# Patient Record
Sex: Female | Born: 1960 | Race: White | Hispanic: No | Marital: Married | Smoking: Never smoker
Health system: Southern US, Community
[De-identification: ages and names within clinical notes are randomized; demographics above are authoritative.]

## PROBLEM LIST (undated history)

## (undated) DIAGNOSIS — M51379 Other intervertebral disc degeneration, lumbosacral region without mention of lumbar back pain or lower extremity pain: Secondary | ICD-10-CM

## (undated) DIAGNOSIS — E039 Hypothyroidism, unspecified: Secondary | ICD-10-CM

## (undated) DIAGNOSIS — J45909 Unspecified asthma, uncomplicated: Secondary | ICD-10-CM

## (undated) DIAGNOSIS — M941 Relapsing polychondritis: Secondary | ICD-10-CM

## (undated) DIAGNOSIS — I10 Essential (primary) hypertension: Secondary | ICD-10-CM

## (undated) DIAGNOSIS — M5137 Other intervertebral disc degeneration, lumbosacral region: Secondary | ICD-10-CM

## (undated) DIAGNOSIS — E785 Hyperlipidemia, unspecified: Secondary | ICD-10-CM

## (undated) DIAGNOSIS — R000 Tachycardia, unspecified: Secondary | ICD-10-CM

## (undated) DIAGNOSIS — L259 Unspecified contact dermatitis, unspecified cause: Secondary | ICD-10-CM

## (undated) HISTORY — DX: Other intervertebral disc degeneration, lumbosacral region: M51.37

## (undated) HISTORY — DX: Unspecified asthma, uncomplicated: J45.909

## (undated) HISTORY — DX: Essential (primary) hypertension: I10

## (undated) HISTORY — DX: Hypothyroidism, unspecified: E03.9

## (undated) HISTORY — DX: Hyperlipidemia, unspecified: E78.5

## (undated) HISTORY — DX: Relapsing polychondritis: M94.1

## (undated) HISTORY — DX: Unspecified contact dermatitis, unspecified cause: L25.9

## (undated) HISTORY — DX: Tachycardia, unspecified: R00.0

## (undated) HISTORY — DX: Other intervertebral disc degeneration, lumbosacral region without mention of lumbar back pain or lower extremity pain: M51.379

## (undated) HISTORY — PX: KNEE SURGERY: SHX244

## (undated) HISTORY — PX: CARPAL TUNNEL RELEASE: SHX101

---

## 1984-07-02 HISTORY — PX: TUBAL LIGATION: SHX77

## 2005-09-18 ENCOUNTER — Encounter: Admission: RE | Admit: 2005-09-18 | Discharge: 2005-09-18 | Payer: Self-pay | Admitting: Family Medicine

## 2005-10-03 HISTORY — PX: US ECHOCARDIOGRAPHY: HXRAD669

## 2007-12-31 ENCOUNTER — Encounter: Admission: RE | Admit: 2007-12-31 | Discharge: 2007-12-31 | Payer: Self-pay | Admitting: Family Medicine

## 2009-12-08 ENCOUNTER — Encounter: Admission: RE | Admit: 2009-12-08 | Discharge: 2009-12-08 | Payer: Self-pay | Admitting: Family Medicine

## 2011-03-03 DIAGNOSIS — L259 Unspecified contact dermatitis, unspecified cause: Secondary | ICD-10-CM

## 2011-03-03 DIAGNOSIS — L308 Other specified dermatitis: Secondary | ICD-10-CM

## 2011-03-03 HISTORY — DX: Unspecified contact dermatitis, unspecified cause: L25.9

## 2011-03-03 HISTORY — DX: Other specified dermatitis: L30.8

## 2011-05-11 ENCOUNTER — Encounter: Payer: Self-pay | Admitting: Cardiology

## 2011-05-14 ENCOUNTER — Ambulatory Visit (INDEPENDENT_AMBULATORY_CARE_PROVIDER_SITE_OTHER): Payer: Medicare Other | Admitting: Cardiology

## 2011-05-14 ENCOUNTER — Encounter: Payer: Self-pay | Admitting: Cardiology

## 2011-05-14 VITALS — BP 145/89 | HR 95 | Resp 18 | Ht 65.0 in | Wt 182.1 lb

## 2011-05-14 DIAGNOSIS — M941 Relapsing polychondritis: Secondary | ICD-10-CM | POA: Insufficient documentation

## 2011-05-14 DIAGNOSIS — R011 Cardiac murmur, unspecified: Secondary | ICD-10-CM | POA: Insufficient documentation

## 2011-05-14 DIAGNOSIS — I10 Essential (primary) hypertension: Secondary | ICD-10-CM | POA: Insufficient documentation

## 2011-05-14 DIAGNOSIS — M948X9 Other specified disorders of cartilage, unspecified sites: Secondary | ICD-10-CM

## 2011-05-14 NOTE — Patient Instructions (Signed)
We will schedule you for an echocardiogram.

## 2011-05-14 NOTE — Progress Notes (Signed)
   Victoria Hull Date of Birth: 12-30-1960 Medical Record #[id number]  History of Present Illness: Victoria Hull is seen today for evaluation of murmur. She is a 50 year old white female who had seen previously in June of 2011 for evaluation of intermittent tachycardia and atypical chest pain. She had a normal cardiac evaluation that time including a stress echo. 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 She has been on steroid therapy. She reports that her dermatologist or her murmur and recommended an echocardiogram. She denies any cardiac complaints. She specifically denies any chest pain, shortness of breath, or palpitations.  Current Outpatient Prescriptions on File Prior to Visit  Medication Sig Dispense Refill  . aspirin 81 MG tablet Take 81 mg by mouth daily.        Marland Kitchen levothyroxine (SYNTHROID, LEVOTHROID) 150 MCG tablet Take 137 mcg by mouth daily.       Marland Kitchen lisinopril (PRINIVIL,ZESTRIL) 20 MG tablet Take 20 mg by mouth daily.          Allergies  Allergen Reactions  . Cats Claw (Uncaria Tomentosa (Cats Claw))     cats  . Penicillins     Past Medical History  Diagnosis Date  . Hypertension   . Hyperlipidemia   . Chest pain   . Tachycardia   . Hypothyroidism   . Relapsing polychondritis     Past Surgical History  Procedure Date  . Tubal ligation   . Knee surgery     RIGHT KNEE  . Carpal tunnel release   . US echocardiography 10/03/2005    EF 55%    History  Smoking status  . Never Smoker   Smokeless tobacco  . Not on file    History  Alcohol Use No    Family History  Problem Relation Age of Onset  . Heart failure Father     Review of Systems: The review of systems is positive for recurrent rash.  All other systems were reviewed and are negative.  Physical Exam: BP 145/89  Pulse 95  Resp 18  Ht 5\' 5"  (1.651 m)  Wt 82.609 kg (182 lb 1.9 oz)  BMI 30.31 kg/m2 The patient is alert and oriented x 3.  The mood and affect are  normal.  The skin is warm and dry.  Color is normal.  The HEENT exam reveals that the sclera are nonicteric.  The mucous membranes are moist.  The carotids are 2+ without bruits.  There is no thyromegaly.  There is no JVD.  The lungs are clear.  The chest wall is non tender.  The heart exam reveals a regular rate with a normal S1 and S2.  There is a very faint and brief systolic ejection murmur at the right upper sternal border..  The PMI is not displaced.   Abdominal exam reveals good bowel sounds.  There is no guarding or rebound.  There is no hepatosplenomegaly or tenderness.  There are no masses.  Exam of the legs reveal no clubbing, cyanosis, or edema.  The legs are without rashes.  The distal pulses are intact.  Cranial nerves II - XII are intact.  Motor and sensory functions are intact.  The gait is normal.  LABORATORY DATA: ECG demonstrates normal sinus rhythm with a normal ECG.  Assessment / Plan:

## 2011-05-14 NOTE — Assessment & Plan Note (Signed)
Her exam is consistent with a very innocent flow murmur. However, given her diagnosis of relapsing polychondritis we will update an echocardiogram to rule out significant valvular disease.

## 2011-05-15 ENCOUNTER — Telehealth: Payer: Self-pay | Admitting: *Deleted

## 2011-05-15 NOTE — Telephone Encounter (Signed)
Faxed office note to Dr. Isaac Bliss [phone]

## 2011-05-22 ENCOUNTER — Ambulatory Visit (HOSPITAL_COMMUNITY): Payer: Medicare Other | Attending: Internal Medicine

## 2011-05-22 DIAGNOSIS — I10 Essential (primary) hypertension: Secondary | ICD-10-CM | POA: Insufficient documentation

## 2011-05-22 DIAGNOSIS — R011 Cardiac murmur, unspecified: Secondary | ICD-10-CM | POA: Insufficient documentation

## 2011-05-22 DIAGNOSIS — E785 Hyperlipidemia, unspecified: Secondary | ICD-10-CM | POA: Insufficient documentation

## 2011-05-22 DIAGNOSIS — I079 Rheumatic tricuspid valve disease, unspecified: Secondary | ICD-10-CM | POA: Insufficient documentation

## 2011-05-22 DIAGNOSIS — M941 Relapsing polychondritis: Secondary | ICD-10-CM

## 2011-05-23 ENCOUNTER — Telehealth: Payer: Self-pay | Admitting: Cardiology

## 2011-05-23 NOTE — Telephone Encounter (Signed)
Fu call °Pt returning your call  °

## 2011-05-23 NOTE — Telephone Encounter (Signed)
Patient was called and told echo showed normal pumping function,no valvular disease,moderate LVH.

## 2011-05-29 ENCOUNTER — Other Ambulatory Visit: Payer: Self-pay | Admitting: Family Medicine

## 2011-05-29 DIAGNOSIS — Z780 Asymptomatic menopausal state: Secondary | ICD-10-CM

## 2011-05-29 DIAGNOSIS — Z1231 Encounter for screening mammogram for malignant neoplasm of breast: Secondary | ICD-10-CM

## 2011-05-29 LAB — HM DEXA SCAN

## 2011-06-01 ENCOUNTER — Institutional Professional Consult (permissible substitution): Payer: Self-pay | Admitting: Cardiovascular Disease

## 2011-06-28 ENCOUNTER — Ambulatory Visit
Admission: RE | Admit: 2011-06-28 | Discharge: 2011-06-28 | Disposition: A | Discharge status: Home / Self Care | Payer: Medicare Other | Source: Ambulatory Visit | Attending: Family Medicine | Admitting: Family Medicine

## 2011-06-28 DIAGNOSIS — Z780 Asymptomatic menopausal state: Secondary | ICD-10-CM

## 2011-06-28 DIAGNOSIS — IMO0002 Reserved for concepts with insufficient information to code with codable children: Secondary | ICD-10-CM

## 2011-06-28 DIAGNOSIS — Z1231 Encounter for screening mammogram for malignant neoplasm of breast: Secondary | ICD-10-CM

## 2011-07-23 ENCOUNTER — Other Ambulatory Visit: Payer: Self-pay | Admitting: Cardiology

## 2012-01-20 ENCOUNTER — Encounter (HOSPITAL_COMMUNITY): Payer: Self-pay

## 2012-01-20 ENCOUNTER — Emergency Department (HOSPITAL_COMMUNITY)
Admission: EM | Admit: 2012-01-20 | Discharge: 2012-01-20 | Disposition: A | Discharge status: Home / Self Care | Payer: Medicare Other | Source: Home / Self Care

## 2012-01-20 ENCOUNTER — Emergency Department (INDEPENDENT_AMBULATORY_CARE_PROVIDER_SITE_OTHER): Payer: Medicare Other

## 2012-01-20 DIAGNOSIS — IMO0002 Reserved for concepts with insufficient information to code with codable children: Secondary | ICD-10-CM

## 2012-01-20 LAB — POCT URINALYSIS DIP (DEVICE)
Bilirubin Urine: NEGATIVE
Glucose, UA: NEGATIVE mg/dL
Ketones, ur: NEGATIVE mg/dL
Nitrite: NEGATIVE

## 2012-01-20 NOTE — ED Notes (Signed)
Pt presents with medial back pain since 7/19. Pt states pain is constant, and "feels like someone punched her in the back and took her breath away". Tender on palpation. Pt reports urinary retention/frequency x 6 weeks. Denies pain on urination.

## 2012-01-20 NOTE — ED Provider Notes (Signed)
History     CSN: [id number]  Arrival date & time 01/20/12  1310   First MD Initiated Contact with Patient 01/20/12 1338      Chief Complaint  Patient presents with  . Back Pain  . Breathing Problem    difficulty breathing    HPI  51 yr old female was doing fair until Friday.  She stated that she had a stabbing pain in her back.  It felt like soemone punched her. It was in the middle of the back and has since moved over to her spine.  The character of pain has changed to a dull constant pain in the right mid back Patient was in the hous at the time and was just gettin ready to start dinner and was waiting on her husban'ds toast to get toasted.   There were no other symptoms assosciated with this-She is known to have a complex history of Relapsing Polychondritis and sees a specialist Dermatologist in [place] She saw her PCP on 12/31/11 Dr. Tanya Nones on the first of July-she told him that she has had some incontinence of the urine She has acutally also been having a lot of LE swelling (chronic) which has gone down since Wednesday She also saw her Dermatologist, Dr. Melina Fiddler at [facility] on Thursday as well and he did some testing for this also-she was recently discontinued off of er Cellcept as well as Doxicyclin for the relapsing polychondritis. She has also recently been slowly tapered off of Chronic steroid therapy for this  When she takes a deep breath it hurts her.  She is not smoker, doesn't take any any hormone replacement-however she states that she has been on a strip and recently Has had a cough for about 6 months She also was Rx with Steroids.    She states last night she took one of her Husband's muscle relaxant pills and felt better   Past Medical History  Diagnosis Date  . Hypertension   . Hyperlipidemia   . Chest pain   . Tachycardia   . Hypothyroidism   . Relapsing polychondritis     Past Surgical History  Procedure Date  . Tubal ligation   . Knee surgery     RIGHT  KNEE  . Carpal tunnel release   . US echocardiography 10/03/2005    EF 55%    Family History  Problem Relation Age of Onset  . Heart failure Father     History  Substance Use Topics  . Smoking status: Never Smoker   . Smokeless tobacco: Not on file  . Alcohol Use: No    OB History    Grav Para Term Preterm Abortions TAB SAB Ect Mult Living                  Review of Systems No SOb, NO CP +Back pain-->Deep breaths cause SOB due to back spasm NO fevers noted-felt warm however No blurred or double vision No cold-has chronic cough since beginning of the year No dysuria No hematuria, but has noted some pink tint to her urine No weakness aon any one side of the body No falls or fainting  Allergies  Cats claw and Penicillins  Home Medications   Current Outpatient Rx  Name Route Sig Dispense Refill  . ASPIRIN 81 MG PO TABS Oral Take 81 mg by mouth daily.      Marland Kitchen DOXYCYCLINE HYCLATE 100 MG PO CPEP Oral Take 100 mg by mouth 2 (two) times daily.      Marland Kitchen  FAMOTIDINE 20 MG PO TABS Oral Take 20 mg by mouth daily.      . OMEGA-3 FATTY ACIDS 1000 MG PO CAPS Oral Take 2 g by mouth daily.      . IRON 66 MG PO TABS Oral Take 65 mg by mouth daily.      Marland Kitchen LEVOTHYROXINE SODIUM 150 MCG PO TABS Oral Take 137 mcg by mouth daily.     Marland Kitchen LISINOPRIL 20 MG PO TABS  TAKE ONE TABLET BY MOUTH EVERY DAY 90 tablet 3  . PREDNISONE PO Oral Take 20 mg by mouth daily.        BP 153/83  Pulse 100  Temp 98.6 F (37 C) (Oral)  Resp 12  SpO2 96%  LMP 08/25/2010  Physical Exam Pleasant alert Caucasian female in no apparent distress Chest has decreased air entry on the right side there is some tactile vocal resonance and fremitus on the right lower lung basal areas No pallor or icterus No nasal discharge noted neck is soft and supple There is point tenderness over the lower back-mainly in the right lower post costal area over ribs 8-10.  Axial rotation of the spine does cause some tenderness and pain  and loading the facet joints with extension causes the same issue Patient is able to bend forward and backward   ED Course  Procedures (including critical care time)  Labs Reviewed  POCT URINALYSIS DIP (DEVICE) - Abnormal; Notable for the following:    Hgb urine dipstick MODERATE (*)     All other components within normal limits   No results found.   No diagnosis found.    MDM  51 year old female who presents with low back pain and some difficulty taking deep breaths My differential diagnoses and broad and includes potential shortness of breath from an undiagnosed pneumonia (x-rays are pending) vs. muscle strain vs. withdrawal from CellCept for her relapsing polychondritis Her UA showed moderate hemoglobin and blood and I am not sure if this is just a confounding factor whether she actually has renal involvement vs. not from her primary process  we will await further management disposition plans depending on results of chest x-ray.  I think that she is at low risk for anything more acute like a PE,MI, especially since her CXR returned negative, and she claimed relief and improved ability to sleep with Muscle relaxant.  i will Rx her with Carbamazepine 2.5 mg qhs, to rpt x1 if worse for sleep a limited quantity  She will need further out-patient work-up with her PCP for 1)Chronic Cough 2) Hematuria  She understood POC as per my discussion with her above.  Pleas Koch, MD            Rhetta Mura, MD 01/21/12 [phone]

## 2012-01-21 ENCOUNTER — Other Ambulatory Visit: Payer: Self-pay | Admitting: Family Medicine

## 2012-01-21 ENCOUNTER — Ambulatory Visit
Admission: RE | Admit: 2012-01-21 | Discharge: 2012-01-21 | Disposition: A | Discharge status: Home / Self Care | Payer: Medicare Other | Source: Ambulatory Visit | Attending: Family Medicine | Admitting: Family Medicine

## 2012-01-21 DIAGNOSIS — R109 Unspecified abdominal pain: Secondary | ICD-10-CM

## 2012-01-21 MED ORDER — DIAZEPAM 5 MG PO TABS: 2.5000 mg | ORAL_TABLET | Freq: Every evening | ORAL | Status: AC | PRN

## 2012-01-21 NOTE — ED Notes (Signed)
Pt seen and treated 7/21 Dr Mahala Menghini - loss of power unable to discharge electronically - written instructions and written prescription for diazepam given to pt  - pt verbalized understanding

## 2012-02-29 ENCOUNTER — Encounter: Payer: Self-pay | Admitting: Emergency Medicine

## 2012-02-29 ENCOUNTER — Ambulatory Visit (INDEPENDENT_AMBULATORY_CARE_PROVIDER_SITE_OTHER): Payer: Medicare Other | Admitting: Emergency Medicine

## 2012-02-29 VITALS — BP 114/80 | HR 94 | Temp 97.8°F | Ht 64.0 in | Wt 185.4 lb

## 2012-02-29 DIAGNOSIS — R05 Cough: Secondary | ICD-10-CM

## 2012-02-29 DIAGNOSIS — R053 Chronic cough: Secondary | ICD-10-CM | POA: Insufficient documentation

## 2012-02-29 NOTE — Progress Notes (Signed)
  Subjective:    Patient ID: Victoria Hull, female    DOB: Jun 17, 1961, 51 y.o.   MRN: [id number]  HPI 51 yo woman, never smoker, hx relapsing polychondritis (formerly on Pred, Cellcept - currently on doxycycline for the rash component), HTN, allergies especially to cats (skin tested 12/2010), Asthma dx by urgent care in 1993. She has rarely needed SABA or any treatment since then. She is referred for chronic cough, started over 7 months ago, has been paroxysmal, non-productive. She was on lisinopril, stopped 02/05/12 and changed to benicar. She was also started on flovent. Her cough is much better after these changes.    Review of Systems  Constitutional: Negative for fever and unexpected weight change.  HENT: Positive for postnasal drip. Negative for ear pain, nosebleeds, congestion, sore throat, rhinorrhea, sneezing, trouble swallowing, dental problem and sinus pressure.   Eyes: Positive for redness and itching.  Respiratory: Positive for cough, shortness of breath and wheezing. Negative for chest tightness.   Cardiovascular: Positive for palpitations and leg swelling.  Gastrointestinal: Positive for nausea and vomiting.  Genitourinary: Negative for dysuria.  Musculoskeletal: Positive for joint swelling.  Skin: Positive for rash.  Neurological: Negative for headaches.  Hematological: Does not bruise/bleed easily.  Psychiatric/Behavioral: Negative for dysphoric mood. The patient is not nervous/anxious.        Objective:   Physical Exam Filed Vitals:   02/29/12 1556  BP: 114/80  Pulse: 94  Temp: 97.8 F (36.6 C)   Gen: Pleasant, well-nourished, in no distress,  normal affect  ENT: No lesions,  mouth clear,  oropharynx clear, no postnasal drip  Neck: No JVD, no TMG, no carotid bruits  Lungs: No use of accessory muscles, no dullness to percussion, clear without rales or rhonchi  Cardiovascular: RRR, heart sounds normal, no murmur or gallops, no peripheral  edema  Musculoskeletal: No deformities, no cyanosis or clubbing  Neuro: alert, non focal  Skin: Warm, no lesions or rashes      Assessment & Plan:  Chronic cough Suspect she probably does have asthma. Other factors that are likely contributing are allergic rhinitis. Stopping the lisinopril may have been the most important factor here. She also believes the flovent is helping.  - contineu flovent - pft - start nasal steroid and lisinopril if cough resumes - rov next available

## 2012-02-29 NOTE — Patient Instructions (Addendum)
Please continue your Flovent twice a day Have your albuterol available to use as needed We will perform full pulmonary function testing at your next office visit If your cough starts to flare, start an allergy regimen that included loratadine (Claritin) 10mg  daily and a steroid nasal spray (call our office to order). Follow with Dr Delton Coombes next available with full PFT

## 2012-02-29 NOTE — Assessment & Plan Note (Addendum)
Suspect she probably does have asthma. Other factors that are likely contributing are allergic rhinitis. Stopping the lisinopril may have been the most important factor here. She also believes the flovent is helping.  - contineu flovent - pft - start nasal steroid and lisinopril if cough resumes - rov next available

## 2012-04-21 ENCOUNTER — Ambulatory Visit (INDEPENDENT_AMBULATORY_CARE_PROVIDER_SITE_OTHER): Payer: Medicare Other | Admitting: Emergency Medicine

## 2012-04-21 ENCOUNTER — Encounter: Payer: Self-pay | Admitting: Emergency Medicine

## 2012-04-21 VITALS — BP 140/82 | HR 88 | Temp 98.5°F | Ht 65.0 in | Wt 186.0 lb

## 2012-04-21 DIAGNOSIS — R05 Cough: Secondary | ICD-10-CM

## 2012-04-21 LAB — PULMONARY FUNCTION TEST

## 2012-04-21 NOTE — Progress Notes (Signed)
PFT done today. 

## 2012-04-21 NOTE — Progress Notes (Signed)
  Subjective:    Patient ID: Victoria Hull, female    DOB: [birth year], 51 y.o.   MRN: [id number]  HPI 51 yo woman, never smoker, hx relapsing polychondritis (formerly on Pred, Cellcept - currently on doxycycline for the rash component), HTN, allergies especially to cats (skin tested 12/2010), Asthma dx by urgent care in 1993. She has rarely needed SABA or any treatment since then. She is referred for chronic cough, started over 7 months ago, has been paroxysmal, non-productive. She was on lisinopril, stopped 02/05/12 and changed to benicar. She was also started on flovent. Her cough is much better after these changes.   ROV 04/21/12 -- hx relapsing polychondritis (formerly on Pred, Cellcept - currently on doxycycline for the rash component), HTN, allergies especially to cats (skin tested 12/2010), Asthma dx by urgent care in 1993. She has rarely needed SABA or any treatment since then. She was better on flovent qd at last visit. Since last time she has gone back on lisinopril, about a month ago. She is still coughing, but knows that stopping flovent makes it worse.       Objective:   Physical Exam Filed Vitals:   04/21/12 1636  BP: 140/82  Pulse: 88  Temp: 98.5 F (36.9 C)   Gen: Pleasant, well-nourished, in no distress,  normal affect  ENT: No lesions,  mouth clear,  oropharynx clear, no postnasal drip  Neck: No JVD, no TMG, no carotid bruits  Lungs: No use of accessory muscles, no dullness to percussion, clear without rales or rhonchi  Cardiovascular: RRR, heart sounds normal, no murmur or gallops, no peripheral edema  Musculoskeletal: No deformities, no cyanosis or clubbing  Neuro: alert, non focal  Skin: Warm, no lesions or rashes      Assessment & Plan:  Chronic cough With evidence on today's PFT for moderate AFL and asthma.  - continue the flovent, SABA prn - stop lisinopril - follow up 6 mo

## 2012-04-21 NOTE — Patient Instructions (Addendum)
Please continue your flovent and albuterol as you are taking them Stop lisinopril Follow with Dr Delton Coombes in 6 months or sooner if you have any problems

## 2012-04-21 NOTE — Assessment & Plan Note (Signed)
With evidence on today's PFT for moderate AFL and asthma.  - continue the flovent, SABA prn - stop lisinopril - follow up 6 mo

## 2012-08-20 ENCOUNTER — Encounter: Payer: Self-pay | Admitting: *Deleted

## 2012-09-23 ENCOUNTER — Other Ambulatory Visit: Payer: Self-pay | Admitting: Family Medicine

## 2012-09-23 ENCOUNTER — Other Ambulatory Visit: Payer: Self-pay | Admitting: Cardiology

## 2012-09-25 ENCOUNTER — Other Ambulatory Visit: Payer: Self-pay | Admitting: Cardiology

## 2012-09-26 NOTE — Telephone Encounter (Signed)
lisinopril (PRINIVIL,ZESTRIL) 20 MG tablet 90 tablet 0 09/23/2012       Sig:  TAKE ONE TABLET BY MOUTH EVERY DAY    Class:  Normal    DAW:  No    Authorizing Provider:  Peter M [place], MD    Ordering User:  Jacquelin Hawking

## 2012-10-20 ENCOUNTER — Encounter: Payer: Self-pay | Admitting: Emergency Medicine

## 2012-10-20 ENCOUNTER — Ambulatory Visit (INDEPENDENT_AMBULATORY_CARE_PROVIDER_SITE_OTHER): Payer: Medicare Other | Admitting: Emergency Medicine

## 2012-10-20 ENCOUNTER — Telehealth: Payer: Self-pay | Admitting: Family Medicine

## 2012-10-20 VITALS — BP 124/84 | HR 88 | Temp 97.8°F | Ht 65.0 in | Wt 190.4 lb

## 2012-10-20 DIAGNOSIS — J45909 Unspecified asthma, uncomplicated: Secondary | ICD-10-CM

## 2012-10-20 DIAGNOSIS — R05 Cough: Secondary | ICD-10-CM

## 2012-10-20 DIAGNOSIS — R059 Cough, unspecified: Secondary | ICD-10-CM

## 2012-10-20 MED ORDER — PRAVASTATIN SODIUM 40 MG PO TABS: ORAL_TABLET | ORAL | Status: DC

## 2012-10-20 NOTE — Assessment & Plan Note (Signed)
Stop flovent Keep albuterol available to use as needed for shortness of breath.  Follow with Dr Delton Coombes in 6 months or sooner if you have any problems

## 2012-10-20 NOTE — Patient Instructions (Addendum)
Stop flovent Keep albuterol available to use as needed for shortness of breath.  Follow with Dr Legacy Carrender in 6 months or sooner if you have any problems 

## 2012-10-20 NOTE — Assessment & Plan Note (Signed)
Improved off lisinopril 

## 2012-10-20 NOTE — Telephone Encounter (Signed)
Rx Refilled  

## 2012-10-20 NOTE — Progress Notes (Signed)
  Subjective:    Patient ID: Victoria Hull, female    DOB: Jun 27, 1961, 52 y.o.   MRN: [id number]  HPI 52 yo woman, never smoker, hx relapsing polychondritis (formerly on Pred, Cellcept - currently on doxycycline for the rash component), HTN, allergies especially to cats (skin tested 12/2010), Asthma dx by urgent care in 1993. She has rarely needed SABA or any treatment since then. She is referred for chronic cough, started over 7 months ago, has been paroxysmal, non-productive. She was on lisinopril, stopped 02/05/12 and changed to benicar. She was also started on flovent. Her cough is much better after these changes.   ROV 54/21/13 -- hx relapsing polychondritis (formerly on Pred, Cellcept - currently on doxycycline for the rash component), HTN, allergies especially to cats (skin tested 12/2010), Asthma dx by urgent care in 1993. She has rarely needed SABA or any treatment since then. She was better on flovent qd at last visit. Since last time she has gone back on lisinopril, about a month ago. She is still coughing, but knows that stopping flovent makes it worse.   ROV 54/21/14 -- hx relapsing polychondritis (formerly on Pred, Cellcept - currently on doxycycline for the rash component), HTN, allergies especially to cats (skin tested 12/2010), Asthma confirmed on spiro 10/'13. Last time we stopped lisinopril, continued flovent, but she has only been using prn. She reports that the cough is better since last time. Polychondritis >> pred just restarted at 5mg . She uses albuterol prn > uses 2x a month. Has some exertional SOB with walking, gardening.      Objective:   Physical Exam Filed Vitals:   10/20/12 1522  BP: 124/84  Pulse: 88  Temp: 97.8 F (36.6 C)   Gen: Pleasant, well-nourished, in no distress,  normal affect  ENT: No lesions,  mouth clear,  oropharynx clear, no postnasal drip  Neck: No JVD, no TMG, no carotid bruits  Lungs: No use of accessory muscles, no dullness to percussion,  clear without rales or rhonchi  Cardiovascular: RRR, heart sounds normal, no murmur or gallops, no peripheral edema  Musculoskeletal: No deformities, no cyanosis or clubbing  Neuro: alert, non focal  Skin: Warm, no lesions or rashes      Assessment & Plan:  Intrinsic asthma Stop flovent Keep albuterol available to use as needed for shortness of breath.  Follow with Dr Delton Coombes in 6 months or sooner if you have any problems  Chronic cough Improved off lisinopril

## 2012-10-21 ENCOUNTER — Other Ambulatory Visit (INDEPENDENT_AMBULATORY_CARE_PROVIDER_SITE_OTHER): Payer: Medicare Other

## 2012-10-21 DIAGNOSIS — Z0000 Encounter for general adult medical examination without abnormal findings: Secondary | ICD-10-CM

## 2012-10-21 DIAGNOSIS — E039 Hypothyroidism, unspecified: Secondary | ICD-10-CM

## 2012-10-21 LAB — TSH: TSH: 3.222 u[IU]/mL (ref 0.350–4.500)

## 2012-10-22 MED ORDER — LEVOTHYROXINE SODIUM 150 MCG PO TABS: 137.0000 ug | ORAL_TABLET | Freq: Every day | ORAL | Status: DC

## 2013-01-01 ENCOUNTER — Telehealth: Payer: Self-pay

## 2013-01-01 MED ORDER — LISINOPRIL 20 MG PO TABS: 20.0000 mg | ORAL_TABLET | Freq: Every day | ORAL | Status: DC

## 2013-01-01 NOTE — Telephone Encounter (Signed)
Spoke to patient she stated she takes lisinopril 20 mg daily and needs a refill.Advised will refill, will check with Dr.Jordan about a follow up appointment.

## 2013-01-13 NOTE — Telephone Encounter (Signed)
Spoke with patient Dr.Jordan advised to needs to get lisinopril refills from PCP.Patient is to follow up with Dr.Jordan as needed.

## 2013-03-04 ENCOUNTER — Ambulatory Visit (INDEPENDENT_AMBULATORY_CARE_PROVIDER_SITE_OTHER): Payer: Medicare Other | Admitting: Family Medicine

## 2013-03-04 ENCOUNTER — Other Ambulatory Visit: Payer: Self-pay | Admitting: Family Medicine

## 2013-03-04 ENCOUNTER — Ambulatory Visit: Payer: Medicare Other

## 2013-03-04 DIAGNOSIS — Z23 Encounter for immunization: Secondary | ICD-10-CM

## 2013-03-04 MED ORDER — LEVOTHYROXINE SODIUM 137 MCG PO TABS: 137.0000 ug | ORAL_TABLET | Freq: Every day | ORAL | Status: DC

## 2013-03-04 NOTE — Telephone Encounter (Signed)
Med refilled for correct dose

## 2013-05-18 ENCOUNTER — Encounter: Payer: Self-pay | Admitting: Emergency Medicine

## 2013-05-18 ENCOUNTER — Ambulatory Visit (INDEPENDENT_AMBULATORY_CARE_PROVIDER_SITE_OTHER): Payer: Medicare Other | Admitting: Emergency Medicine

## 2013-05-18 VITALS — BP 170/100 | HR 88 | Ht 65.0 in | Wt 190.0 lb

## 2013-05-18 DIAGNOSIS — J45909 Unspecified asthma, uncomplicated: Secondary | ICD-10-CM

## 2013-05-18 MED ORDER — FLUTICASONE PROPIONATE HFA 110 MCG/ACT IN AERO: 2.0000 | INHALATION_SPRAY | Freq: Two times a day (BID) | RESPIRATORY_TRACT | Status: DC | PRN

## 2013-05-18 MED ORDER — ALBUTEROL SULFATE HFA 108 (90 BASE) MCG/ACT IN AERS: 2.0000 | INHALATION_SPRAY | Freq: Four times a day (QID) | RESPIRATORY_TRACT | Status: DC | PRN

## 2013-05-18 NOTE — Assessment & Plan Note (Signed)
-   at this time will wait on the flovent, see if restarting cellcept and pred will be adequate.  - saba prn - trigger avoidance.

## 2013-05-18 NOTE — Patient Instructions (Signed)
Please continue your Cellcept and prednisone as you are taking them Use albuterol as needed Continue your famotidine Follow with Dr Delton Coombes in 4 months or sooner if you have any problems.

## 2013-05-18 NOTE — Progress Notes (Signed)
  Subjective:    Patient ID: Victoria Hull, female    DOB: 07-06-1960, 52 y.o.   MRN: [id number]  HPI 52 yo woman, never smoker, hx relapsing polychondritis (formerly on Pred, Cellcept - currently on doxycycline for the rash component), HTN, allergies especially to cats (skin tested 12/2010), Asthma dx by urgent care in 1993. She has rarely needed SABA or any treatment since then. She is referred for chronic cough, started over 7 months ago, has been paroxysmal, non-productive. She was on lisinopril, stopped 02/05/12 and changed to benicar. She was also started on flovent. Her cough is much better after these changes.   ROV 04/21/12 -- hx relapsing polychondritis (formerly on Pred, Cellcept - currently on doxycycline for the rash component), HTN, allergies especially to cats (skin tested 12/2010), Asthma dx by urgent care in 1993. She has rarely needed SABA or any treatment since then. She was better on flovent qd at last visit. Since last time she has gone back on lisinopril, about a month ago. She is still coughing, but knows that stopping flovent makes it worse.   ROV 10/20/12 -- hx relapsing polychondritis (formerly on Pred, Cellcept - currently on doxycycline for the rash component), HTN, allergies especially to cats (skin tested 12/2010), Asthma confirmed on spiro 10/'13. Last time we stopped lisinopril, continued flovent, but she has only been using prn. She reports that the cough is better since last time. Polychondritis >> pred just restarted at 5mg . She uses albuterol prn > uses 2x a month. Has some exertional SOB with walking, gardening.   ROV 05/18/13 -- hx relapsing polychondritis (formerly on Pred, Cellcept - currently on doxycycline for the rash component), HTN, allergies especially to cats (skin tested 12/2010), Asthma.  Last time we stopped flovent > seemed to tolerate. She also stopped all her other meds - pred, cellcept, etc x 3 months. She since had to restart them, had rash as initial sx.  Now back pred 10, cellcept.  Now using albuterol bid now (was quite rare).      Objective:   Physical Exam Filed Vitals:   05/18/13 1522  BP: 170/100  Pulse: 88  Height: 5\' 5"  (1.651 m)  Weight: 190 lb (86.183 kg)  SpO2: 96%   Gen: Pleasant, well-nourished, in no distress,  normal affect  ENT: No lesions,  mouth clear,  oropharynx clear, no postnasal drip  Neck: No JVD, no TMG, no carotid bruits  Lungs: No use of accessory muscles, clear without rales or rhonchi  Cardiovascular: RRR, heart sounds normal, no murmur or gallops, no peripheral edema  Musculoskeletal: No deformities, no cyanosis or clubbing  Neuro: alert, non focal  Skin: Warm, no lesions or rashes      Assessment & Plan:  Intrinsic asthma - at this time will wait on the flovent, see if restarting cellcept and pred will be adequate.  - saba prn - trigger avoidance.

## 2013-06-04 ENCOUNTER — Encounter: Payer: Self-pay | Admitting: Family Medicine

## 2013-06-05 ENCOUNTER — Other Ambulatory Visit: Payer: Self-pay | Admitting: Family Medicine

## 2013-06-05 ENCOUNTER — Other Ambulatory Visit: Payer: Medicare Other

## 2013-06-05 DIAGNOSIS — E785 Hyperlipidemia, unspecified: Secondary | ICD-10-CM

## 2013-06-05 DIAGNOSIS — Z79899 Other long term (current) drug therapy: Secondary | ICD-10-CM

## 2013-06-05 DIAGNOSIS — I10 Essential (primary) hypertension: Secondary | ICD-10-CM

## 2013-06-05 DIAGNOSIS — E039 Hypothyroidism, unspecified: Secondary | ICD-10-CM

## 2013-06-05 LAB — LIPID PANEL
Cholesterol: 194 mg/dL (ref 0–200)
HDL: 37 mg/dL — ABNORMAL LOW (ref >39)
Total CHOL/HDL Ratio: 5.2 Ratio
Triglycerides: 202 mg/dL — ABNORMAL HIGH (ref <150)
VLDL: 40 mg/dL (ref 0–40)

## 2013-06-05 LAB — CBC WITH DIFFERENTIAL/PLATELET
Basophils Absolute: 0 10*3/uL (ref 0.0–0.1)
Basophils Relative: 0 % (ref 0–1)
Eosinophils Relative: 3 % (ref 0–5)
HCT: 35.4 % — ABNORMAL LOW (ref 36.0–46.0)
Hemoglobin: 11.4 g/dL — ABNORMAL LOW (ref 12.0–15.0)
Lymphs Abs: 3.2 10*3/uL (ref 0.7–4.0)
MCH: 25.4 pg — ABNORMAL LOW (ref 26.0–34.0)
MCHC: 32.2 g/dL (ref 30.0–36.0)
MCV: 79 fL (ref 78.0–100.0)
Monocytes Absolute: 0.5 10*3/uL (ref 0.1–1.0)
RBC: 4.48 MIL/uL (ref 3.87–5.11)
RDW: 16.8 % — ABNORMAL HIGH (ref 11.5–15.5)

## 2013-06-05 LAB — COMPLETE METABOLIC PANEL WITH GFR
AST: 11 U/L (ref 0–37)
Alkaline Phosphatase: 87 U/L (ref 39–117)
BUN: 23 mg/dL (ref 6–23)
Calcium: 9.1 mg/dL (ref 8.4–10.5)
Creat: 0.85 mg/dL (ref 0.50–1.10)
GFR, Est Non African American: 78 mL/min

## 2013-06-05 NOTE — Telephone Encounter (Signed)
I called patient.  She is coming this morning for fasting lab work and then will schedule appt to see provider early next week

## 2013-06-09 ENCOUNTER — Encounter: Payer: Self-pay | Admitting: Family Medicine

## 2013-06-09 ENCOUNTER — Ambulatory Visit (INDEPENDENT_AMBULATORY_CARE_PROVIDER_SITE_OTHER): Payer: Medicare Other | Admitting: Family Medicine

## 2013-06-09 VITALS — BP 150/80 | HR 84 | Temp 97.5°F | Resp 18 | Ht 65.0 in | Wt 188.0 lb

## 2013-06-09 DIAGNOSIS — Z1211 Encounter for screening for malignant neoplasm of colon: Secondary | ICD-10-CM

## 2013-06-09 DIAGNOSIS — I10 Essential (primary) hypertension: Secondary | ICD-10-CM

## 2013-06-09 DIAGNOSIS — E785 Hyperlipidemia, unspecified: Secondary | ICD-10-CM

## 2013-06-09 MED ORDER — OMEPRAZOLE 20 MG PO CPDR: 40.0000 mg | DELAYED_RELEASE_CAPSULE | Freq: Every day | ORAL | Status: DC

## 2013-06-09 NOTE — Progress Notes (Signed)
Subjective:    Patient ID: Victoria Hull, female    DOB: 1961/03/10, 52 y.o.   MRN: [id number]  HPI Patient is here today for followup of her hypertension as well as her hyperlipidemia. She is going only start and 50 mg by mouth daily. Her blood pressures ranging 140-150 over 80s. She denies any chest pain, shortness of breath, dyspnea on exertion. She does report a persistent daily cough which she thinks may be due to her acid reflux. She is currently on Pepcid. She denies any melena or hematochezia. She denies any indigestion. She does have occasional postnasal drip. She is also on pravastatin 40 mg by mouth daily for hyperlipidemia. She denies any myalgias or right upper quadrant pain. Her most recent labwork as listed below: Appointment on 06/05/2013  Component Date Value Range Status  . Cholesterol 06/05/2013 194  0 - 200 mg/dL Final   Comment: ATP III Classification:                                < 200        mg/dL        Desirable                               200 - 239     mg/dL        Borderline High                               >= 240        mg/dL        High                             . Triglycerides 06/05/2013 202* <150 mg/dL Final  . HDL 57/84/6962 37* >39 mg/dL Final  . Total CHOL/HDL Ratio 06/05/2013 5.2   Final  . VLDL 06/05/2013 40  0 - 40 mg/dL Final  . LDL Cholesterol 06/05/2013 117* 0 - 99 mg/dL Final   Comment:                            Total Cholesterol/HDL Ratio:CHD Risk                                                 Coronary Heart Disease Risk Table                                                                 Men       Women                                   1/2 Average Risk              3.4        3.3  Average Risk              5.0        4.4                                    2X Average Risk              9.6        7.1                                    3X Average Risk             23.4       11.0          Use the calculated Patient Ratio above and the CHD Risk table                           to determine the patient's CHD Risk.                          ATP III Classification (LDL):                                < 100        mg/dL         Optimal                               100 - 129     mg/dL         Near or Above Optimal                               130 - 159     mg/dL         Borderline High                               160 - 189     mg/dL         High                                > 190        mg/dL         Very High                             . TSH 06/05/2013 1.461  0.350 - 4.500 uIU/mL Final  . WBC 06/05/2013 9.8  4.0 - 10.5 K/uL Final  . RBC 06/05/2013 4.48  3.87 - 5.11 MIL/uL Final  . Hemoglobin 06/05/2013 11.4* 12.0 - 15.0 g/dL Final  . HCT 16/04/9603 35.4* 36.0 - 46.0 % Final  . MCV 06/05/2013 79.0  78.0 - 100.0 fL Final  . MCH 06/05/2013 25.4* 26.0 - 34.0 pg Final  . MCHC 06/05/2013 32.2  30.0 - 36.0 g/dL Final  . RDW 54/03/8118 16.8* 11.5 - 15.5 % Final  . Platelets 06/05/2013 386  150 - 400  K/uL Final  . Neutrophils Relative % 06/05/2013 60  43 - 77 % Final  . Neutro Abs 06/05/2013 5.9  1.7 - 7.7 K/uL Final  . Lymphocytes Relative 06/05/2013 32  12 - 46 % Final  . Lymphs Abs 06/05/2013 3.2  0.7 - 4.0 K/uL Final  . Monocytes Relative 06/05/2013 5  3 - 12 % Final  . Monocytes Absolute 06/05/2013 0.5  0.1 - 1.0 K/uL Final  . Eosinophils Relative 06/05/2013 3  0 - 5 % Final  . Eosinophils Absolute 06/05/2013 0.3  0.0 - 0.7 K/uL Final  . Basophils Relative 06/05/2013 0  0 - 1 % Final  . Basophils Absolute 06/05/2013 0.0  0.0 - 0.1 K/uL Final  . Smear Review 06/05/2013 Criteria for review not met   Final  . Sodium 06/05/2013 141  135 - 145 mEq/L Final  . Potassium 06/05/2013 4.4  3.5 - 5.3 mEq/L Final  . Chloride 06/05/2013 103  96 - 112 mEq/L Final  . CO2 06/05/2013 27  19 - 32 mEq/L Final  . Glucose, Bld 06/05/2013 84  70 - 99 mg/dL Final  . BUN 81/19/1478 23   6 - 23 mg/dL Final  . Creat 29/56/2130 0.85  0.50 - 1.10 mg/dL Final  . Total Bilirubin 06/05/2013 0.3  0.3 - 1.2 mg/dL Final  . Alkaline Phosphatase 06/05/2013 87  39 - 117 U/L Final  . AST 06/05/2013 11  0 - 37 U/L Final  . ALT 06/05/2013 <8  0 - 35 U/L Final  . Total Protein 06/05/2013 6.9  6.0 - 8.3 g/dL Final  . Albumin 86/57/8469 3.6  3.5 - 5.2 g/dL Final  . Calcium 62/95/2841 9.1  8.4 - 10.5 mg/dL Final  . GFR, Est African American 06/05/2013 >89   Final  . GFR, Est Non African American 06/05/2013 78   Final   Comment:                            The estimated GFR is a calculation valid for adults (>=36 years old)                          that uses the CKD-EPI algorithm to adjust for age and sex. It is                            not to be used for children, pregnant women, hospitalized patients,                             patients on dialysis, or with rapidly changing kidney function.                          According to the NKDEP, eGFR >89 is normal, 60-89 shows mild                          impairment, 30-59 shows moderate impairment, 15-29 shows severe                          impairment and <15 is ESRD.  Past Medical History  Diagnosis Date  . Hypertension   . Hyperlipidemia   . Chest pain   . Tachycardia   . Hypothyroidism   . Relapsing polychondritis   . Asthma   . DDD (degenerative disc disease), lumbosacral   . 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 albuterol (PROAIR HFA) 108 (90 BASE) MCG/ACT inhaler Inhale 2 puffs into the lungs every 6 (six) hours as needed.  1 Inhaler  6  . aspirin 81 MG tablet Take 81 mg by mouth daily.        . Calcium Carbonate-Vitamin D (CALCIUM + D PO) Take by mouth once.      . clobetasol cream (TEMOVATE) 0.05 % Apply topically Twice daily as needed.      . doxycycline (VIBRAMYCIN) 100 MG capsule Take 100 mg  by mouth 2 (two) times daily.      . famotidine (PEPCID) 20 MG tablet Take 20 mg by mouth daily.        . fish oil-omega-3 fatty acids 1000 MG capsule Take 2 g by mouth daily. 1300mg  daily      . fluticasone (FLOVENT HFA) 110 MCG/ACT inhaler Inhale 2 puffs into the lungs 2 (two) times daily as needed.  1 Inhaler  11  . levothyroxine (SYNTHROID, LEVOTHROID) 137 MCG tablet Take 1 tablet (137 mcg total) by mouth daily before breakfast.  90 tablet  3  . losartan (COZAAR) 50 MG tablet 1 tablet daily.      . Multiple Vitamin (MULTIVITAMIN) capsule Take 1 capsule by mouth daily.      . Multiple Vitamins-Minerals (VISION FORMULA PO) Take 1 tablet by mouth daily.       . Nutritional Supplements (ESTROVEN PO) Take 1 tablet by mouth daily.      . pravastatin (PRAVACHOL) 40 MG tablet TAKE ONE TABLET BY MOUTH EVERY DAY AT BEDTIME  30 tablet  5   No current facility-administered medications on file prior to visit.   Allergies  Allergen Reactions  . Cats Claw [Uncaria Tomentosa (Cats Claw)]     cats  . Niaspan [Niacin] Other (See Comments)    Myalgias in legs  . Penicillins    History   Social History  . Marital Status: Married    Spouse Name: N/A    Number of Children: 5  . Years of Education: N/A   Occupational History  . Not on file.   Social History Main Topics  . Smoking status: Never Smoker   . Smokeless tobacco: Never Used  . Alcohol Use: No  . Drug Use: No  . Sexual Activity: Not on file   Other Topics Concern  . Not on file   Social History Narrative  . No narrative on file      Review of Systems  All other systems reviewed and are negative.       Objective:   Physical Exam  Vitals reviewed. Neck: Neck supple. No JVD present. No thyromegaly present.  Cardiovascular: Normal rate, regular rhythm and normal heart sounds.  Exam reveals no gallop and no friction rub.   No murmur heard. Pulmonary/Chest: Effort normal and breath sounds normal. No respiratory distress.  She has no wheezes. She has no rales.  Abdominal: Soft. Bowel sounds are normal. She exhibits no distension and no mass. There is no tenderness. There is no rebound and no guarding.  Musculoskeletal: She exhibits no edema.  Lymphadenopathy:    She has no cervical adenopathy.          Assessment & Plan:  1. HLD (hyperlipidemia) Cholesterol is acceptable. I recommended increasing aerobic exercise to try to raise her HDL. Patient refuses to take Niaspan 2. HTN (hypertension) Pressure is elevated. Increase Cozaar to 100 mg by mouth daily. Recheck in 2-3 weeks. Pressure is still greater than 140/90 I would add hydrochlorothiazide 25 mg by mouth daily  3. Screen for colon cancer Patient is scheduling her mammogram for December. She is overdue for a Pap smear. I recommended a Pap smear but the patient refuses. I also recommended a colonoscopy. The patient will consent to a virtual colonoscopy. She has a history of post anesthesia recall.  She wakes during surgical procedures. Therefore she refuses any actual colonoscopy unless absolutely necessary. I will do the best I can to schedule a virtual colonoscopy. - CT Virtual Colonoscopy Screening; Future

## 2013-06-11 ENCOUNTER — Encounter: Payer: Self-pay | Admitting: Cardiology

## 2013-06-11 ENCOUNTER — Ambulatory Visit (INDEPENDENT_AMBULATORY_CARE_PROVIDER_SITE_OTHER): Payer: Medicare Other | Admitting: Cardiology

## 2013-06-11 ENCOUNTER — Telehealth: Payer: Self-pay | Admitting: Family Medicine

## 2013-06-11 VITALS — BP 144/96 | HR 66 | Ht 65.0 in | Wt 182.0 lb

## 2013-06-11 DIAGNOSIS — I10 Essential (primary) hypertension: Secondary | ICD-10-CM

## 2013-06-11 DIAGNOSIS — R000 Tachycardia, unspecified: Secondary | ICD-10-CM | POA: Insufficient documentation

## 2013-06-11 NOTE — Patient Instructions (Signed)
Follow with Dr. Tanya Nones concerning your blood pressure.  I will see you as needed.

## 2013-06-11 NOTE — Progress Notes (Signed)
Victoria Hull Date of Birth: 05-Sep-1960 Medical Record #[id number]  History of Present Illness: Victoria Hull is seen for follow up. She has a history of intermittent tachycardia and atypical chest pain. She had a normal cardiac evaluation that time including a stress echo. She had a normal Echo in 2012. She denies any cardiac complaints. She specifically denies any chest pain, shortness of breath. She does have palpitations about once a month that last less than 2 minutes.  Current Outpatient Prescriptions on File Prior to Visit  Medication Sig Dispense Refill  . albuterol (PROAIR HFA) 108 (90 BASE) MCG/ACT inhaler Inhale 2 puffs into the lungs every 6 (six) hours as needed.  1 Inhaler  6  . aspirin 81 MG tablet Take 81 mg by mouth daily.        . Calcium Carbonate-Vitamin D (CALCIUM + D PO) Take by mouth once.      . clobetasol cream (TEMOVATE) 0.05 % Apply topically Twice daily as needed.      . doxycycline (VIBRAMYCIN) 100 MG capsule Take 100 mg by mouth 2 (two) times daily.      . fish oil-omega-3 fatty acids 1000 MG capsule Take 2 g by mouth daily. 1300mg  daily      . fluticasone (FLOVENT HFA) 110 MCG/ACT inhaler Inhale 2 puffs into the lungs 2 (two) times daily as needed.  1 Inhaler  11  . levothyroxine (SYNTHROID, LEVOTHROID) 137 MCG tablet Take 1 tablet (137 mcg total) by mouth daily before breakfast.  90 tablet  3  . losartan (COZAAR) 50 MG tablet 2 tablets daily.       . Multiple Vitamins-Minerals (VISION FORMULA PO) Take 1 tablet by mouth daily.       . Nutritional Supplements (ESTROVEN PO) Take 1 tablet by mouth daily.      Marland Kitchen omeprazole (PRILOSEC) 20 MG capsule Take 2 capsules (40 mg total) by mouth daily.  60 capsule  3  . pravastatin (PRAVACHOL) 40 MG tablet TAKE ONE TABLET BY MOUTH EVERY DAY AT BEDTIME  30 tablet  5  . predniSONE (DELTASONE) 10 MG tablet Take 5 mg by mouth daily with breakfast.       . Multiple Vitamin (MULTIVITAMIN) capsule Take 1 capsule by mouth daily.        No current facility-administered medications on file prior to visit.    Allergies  Allergen Reactions  . Cats Claw [Uncaria Tomentosa (Cats Claw)]     cats  . Niaspan [Niacin] Other (See Comments)    Myalgias in legs  . Penicillins     Past Medical History  Diagnosis Date  . Hypertension   . Hyperlipidemia   . Chest pain   . Tachycardia   . Hypothyroidism   . Relapsing polychondritis   . Asthma   . DDD (degenerative disc disease), lumbosacral   . 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 Tubal ligation  1986  . Knee surgery      RIGHT KNEE  . Carpal tunnel release      bilateral  . US echocardiography  10/03/2005    EF 55%    History  Smoking status  . Never Smoker   Smokeless tobacco  . Never Used    History  Alcohol Use No    Family History  Problem Relation Age of Onset  . Heart failure Father   . Hyperlipidemia Father     chf  . Diabetes Father   .  Parkinson's disease Father   . Heart disease Father     Premature CAD Fam HX  . Emphysema Paternal Aunt   . Asthma Brother   . Asthma Child   . Asthma Grandchild   . Hypertension Mother   . Hypothyroidism Mother   . Cancer Daughter     CERVICAL    Review of Systems: The review of systems is positive for recurrent rash.  All other systems were reviewed and are negative.  Physical Exam: BP 144/96  Pulse 66  Ht 5\' 5"  (1.651 m)  Wt 182 lb (82.555 kg)  BMI 30.29 kg/m2  LMP 08/25/2010 She is a WDWF in NAD. The HEENT exam is normal.  The carotids are 2+ without bruits.  There is no thyromegaly.  There is no JVD.  The lungs are clear.  The chest wall is non tender.  The heart exam reveals a regular rate with a normal S1 and S2.  No murmur or gallop.  The PMI is not displaced.   Abdominal exam reveals good bowel sounds.  There is no guarding or rebound.  There is no hepatosplenomegaly or tenderness.  There are no masses.  Exam of the legs  reveal no clubbing, cyanosis, or edema.  The legs are without rashes.  The distal pulses are intact.  Cranial nerves II - XII are intact.  Motor and sensory functions are intact.  The gait is normal.  LABORATORY DATA: ECG demonstrates normal sinus rhythm with a normal ECG.  Assessment / Plan: 1. Palpitations. Infrequent. No further therapy needed.  2. HTN. Losartan dose recently increased- will f/u with Dr. Tanya Nones.  3. Murmur- none noted today. Prior Echo is normal.  I will follow up prn.

## 2013-06-11 NOTE — Telephone Encounter (Signed)
Pt has a referral for a CT virtual colonoscopy screening and medicare will not cover it and it would cost her 865$ and she can't afford that so she is wanting to know if there is anything else that can be done that would be covered by medicare Call back number is [phone]

## 2013-06-11 NOTE — Telephone Encounter (Signed)
A standard colonoscopy should be covered or a flex sigmoidoscopy which would not require anesthesia.

## 2013-06-15 NOTE — Telephone Encounter (Signed)
Patient aware.

## 2013-06-19 ENCOUNTER — Other Ambulatory Visit: Payer: Self-pay | Admitting: Family Medicine

## 2013-06-19 ENCOUNTER — Encounter: Payer: Self-pay | Admitting: Family Medicine

## 2013-06-19 MED ORDER — LOSARTAN POTASSIUM 50 MG PO TABS: 100.0000 mg | ORAL_TABLET | Freq: Every day | ORAL | Status: DC

## 2013-06-19 MED ORDER — AMLODIPINE BESYLATE 5 MG PO TABS: 5.0000 mg | ORAL_TABLET | Freq: Every day | ORAL | Status: DC

## 2013-06-19 NOTE — Telephone Encounter (Signed)
Med refilled and new rx sent to pharm as well

## 2013-07-22 ENCOUNTER — Encounter: Payer: Self-pay | Admitting: Family Medicine

## 2013-07-27 ENCOUNTER — Other Ambulatory Visit: Payer: Self-pay | Admitting: Family Medicine

## 2013-07-27 MED ORDER — DOXAZOSIN MESYLATE 4 MG PO TABS: 4.0000 mg | ORAL_TABLET | Freq: Every day | ORAL | Status: DC

## 2013-08-06 ENCOUNTER — Encounter: Payer: Self-pay | Admitting: Family Medicine

## 2013-08-10 ENCOUNTER — Encounter: Payer: Self-pay | Admitting: Family Medicine

## 2013-08-12 ENCOUNTER — Other Ambulatory Visit: Payer: Self-pay | Admitting: Family Medicine

## 2013-08-12 MED ORDER — CLONIDINE HCL 0.1 MG PO TABS: 0.1000 mg | ORAL_TABLET | Freq: Two times a day (BID) | ORAL | Status: DC

## 2013-08-15 ENCOUNTER — Other Ambulatory Visit: Payer: Self-pay | Admitting: Family Medicine

## 2013-08-15 MED ORDER — PRAVASTATIN SODIUM 40 MG PO TABS: ORAL_TABLET | ORAL | Status: DC

## 2013-08-15 NOTE — Telephone Encounter (Signed)
Rx Refilled  

## 2013-08-21 ENCOUNTER — Encounter: Payer: Self-pay | Admitting: Family Medicine

## 2013-09-07 ENCOUNTER — Ambulatory Visit (INDEPENDENT_AMBULATORY_CARE_PROVIDER_SITE_OTHER): Payer: Medicare Other | Admitting: Family Medicine

## 2013-09-07 ENCOUNTER — Other Ambulatory Visit: Payer: Self-pay | Admitting: Family Medicine

## 2013-09-07 ENCOUNTER — Encounter: Payer: Self-pay | Admitting: Family Medicine

## 2013-09-07 VITALS — BP 128/90 | HR 78 | Temp 97.0°F | Resp 18 | Ht 65.0 in | Wt 189.0 lb

## 2013-09-07 DIAGNOSIS — Z23 Encounter for immunization: Secondary | ICD-10-CM

## 2013-09-07 DIAGNOSIS — R238 Other skin changes: Secondary | ICD-10-CM

## 2013-09-07 DIAGNOSIS — I10 Essential (primary) hypertension: Secondary | ICD-10-CM

## 2013-09-07 DIAGNOSIS — R233 Spontaneous ecchymoses: Secondary | ICD-10-CM

## 2013-09-07 DIAGNOSIS — D649 Anemia, unspecified: Secondary | ICD-10-CM

## 2013-09-07 LAB — COMPLETE METABOLIC PANEL WITH GFR
ALT: 12 U/L (ref 0–35)
AST: 12 U/L (ref 0–37)
Albumin: 4.3 g/dL (ref 3.5–5.2)
Alkaline Phosphatase: 86 U/L (ref 39–117)
BILIRUBIN TOTAL: 0.3 mg/dL (ref 0.2–1.2)
BUN: 18 mg/dL (ref 6–23)
CO2: 30 mEq/L (ref 19–32)
CREATININE: 0.89 mg/dL (ref 0.50–1.10)
Calcium: 9.7 mg/dL (ref 8.4–10.5)
Chloride: 100 mEq/L (ref 96–112)
GFR, Est African American: 85 mL/min
GFR, Est Non African American: 74 mL/min
Glucose, Bld: 113 mg/dL — ABNORMAL HIGH (ref 70–99)
Potassium: 4.4 mEq/L (ref 3.5–5.3)
Sodium: 139 mEq/L (ref 135–145)
Total Protein: 7.1 g/dL (ref 6.0–8.3)

## 2013-09-07 LAB — LIPID PANEL
CHOLESTEROL: 230 mg/dL — AB (ref 0–200)
HDL: 37 mg/dL — AB (ref >39)
LDL CALC: 120 mg/dL — AB (ref 0–99)
TRIGLYCERIDES: 365 mg/dL — AB (ref <150)
Total CHOL/HDL Ratio: 6.2 Ratio
VLDL: 73 mg/dL — AB (ref 0–40)

## 2013-09-07 LAB — PT WITH INR/FINGERSTICK
INR, fingerstick: 1 (ref 0.80–1.20)
PT FINGERSTICK: 12.4 s (ref 10.4–12.5)

## 2013-09-07 LAB — APTT: APTT: 30 s (ref 24–37)

## 2013-09-07 MED ORDER — CLONIDINE HCL 0.1 MG PO TABS: 0.2000 mg | ORAL_TABLET | Freq: Two times a day (BID) | ORAL | Status: DC

## 2013-09-07 MED ORDER — CLONIDINE HCL 0.1 MG PO TABS: 0.1000 mg | ORAL_TABLET | Freq: Two times a day (BID) | ORAL | Status: DC

## 2013-09-07 NOTE — Progress Notes (Signed)
Subjective:    Patient ID: Victoria Hull, female    DOB: 01-Jan-1961, 53 y.o.   MRN: [id number]  HPI  Patient presents for followup of her hypertension. She continues to have systolic blood pressures ranging 1:30 to 155/77-83. She is currently on clonidine 0.1 mg by mouth twice a day and Zosyn 4 mg by mouth daily. She is losartan and amlodipine. He cannot tolerate ACE inhibitors. She cannot tolerate beta blockers due to her asthma. She complains of a dry mouth on medication.  She denies any chest pain. She does report easy bruising on her forearms. Her most recent hemoglobin was suppressed at 9.9. It was borderline microcytic and 80. Her most recent hemoglobin in August was 11.4. This was obtained in December. She is never had an EGD or colonoscopy and she refuses this. I have repeatedly recommended an EGD and colonoscopy. Past Medical History  Diagnosis Date  . Hypertension   . Hyperlipidemia   . Chest pain   . Tachycardia   . Hypothyroidism   . Relapsing polychondritis   . Asthma   . DDD (degenerative disc disease), lumbosacral   . 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 albuterol (PROAIR HFA) 108 (90 BASE) MCG/ACT inhaler Inhale 2 puffs into the lungs every 6 (six) hours as needed.  1 Inhaler  6  . aspirin 81 MG tablet Take 81 mg by mouth daily.        . Calcium Carbonate-Vitamin D (CALCIUM + D PO) Take by mouth once.      . doxazosin (CARDURA) 4 MG tablet Take 1 tablet (4 mg total) by mouth daily.  30 tablet  5  . levothyroxine (SYNTHROID, LEVOTHROID) 137 MCG tablet Take 1 tablet (137 mcg total) by mouth daily before breakfast.  90 tablet  3  . Multiple Vitamin (MULTIVITAMIN) capsule Take 1 capsule by mouth daily.      . Multiple Vitamins-Minerals (VISION FORMULA PO) Take 1 tablet by mouth daily.       Marland Kitchen omeprazole (PRILOSEC) 20 MG capsule Take 2 capsules (40 mg total) by mouth  daily.  60 capsule  3  . pravastatin (PRAVACHOL) 40 MG tablet TAKE ONE TABLET BY MOUTH EVERY DAY AT BEDTIME  30 tablet  5  . predniSONE (DELTASONE) 10 MG tablet Take 5 mg by mouth daily with breakfast.        No current facility-administered medications on file prior to visit.   Allergies  Allergen Reactions  . Cats Claw [Uncaria Tomentosa (Cats Claw)]     cats  . Niaspan [Niacin] Other (See Comments)    Myalgias in legs  . Penicillins    History   Social History  . Marital Status: Married    Spouse Name: N/A    Number of Children: 5  . Years of Education: N/A   Occupational History  . Not on file.   Social History Main Topics  . Smoking status: Never Smoker   . Smokeless tobacco: Never Used  . Alcohol Use: No  . Drug Use: No  . Sexual Activity: Not on file   Other Topics Concern  . Not on file   Social History Narrative  . No narrative on file     Review of Systems  All other systems reviewed and are negative.       Objective:   Physical Exam  Vitals reviewed. Constitutional: She appears well-developed and well-nourished.  Neck: Neck supple. No JVD present. No thyromegaly present.  Cardiovascular: Normal rate, regular rhythm and normal heart sounds.   No murmur heard. Pulmonary/Chest: Effort normal and breath sounds normal. No respiratory distress. She has no wheezes. She has no rales. She exhibits no tenderness.  Abdominal: Soft. Bowel sounds are normal. She exhibits no distension. There is no tenderness. There is no rebound and no guarding.  Lymphadenopathy:    She has no cervical adenopathy.          Assessment & Plan:  1. Easy bruising I believe the patient is developing senile purpura. Her most recent platelet level was normal per her report. I will check a PT and PTT and INR to rule out other bleeding dyscrasias. If this worsens consider von Willebrand's. - PT with INR/Fingerstick - APTT  2. HTN (hypertension) Discontinue doxazosin and  increase clonidine to 0.2 mg by mouth twice a day - COMPLETE METABOLIC PANEL WITH GFR - Lipid panel  3. Need for prophylactic vaccination against Streptococcus pneumoniae (pneumococcus) - Pneumococcal conjugate vaccine 13-valent IM  4. Anemia I have repeatedly recommended an EGD and colonoscopy which the patient refuses. I recommend iron supplementation to help treat the anemia although I have no specific cause of the anemia identified

## 2013-09-08 ENCOUNTER — Encounter: Payer: Self-pay | Admitting: Family Medicine

## 2013-09-08 LAB — TSH: TSH: 5.95 u[IU]/mL — ABNORMAL HIGH (ref 0.350–4.500)

## 2013-09-14 ENCOUNTER — Encounter: Payer: Self-pay | Admitting: Family Medicine

## 2013-10-08 ENCOUNTER — Other Ambulatory Visit: Payer: Medicare Other

## 2013-10-08 ENCOUNTER — Other Ambulatory Visit: Payer: Self-pay | Admitting: Family Medicine

## 2013-10-08 ENCOUNTER — Encounter: Payer: Self-pay | Admitting: Family Medicine

## 2013-10-08 DIAGNOSIS — Z79899 Other long term (current) drug therapy: Secondary | ICD-10-CM

## 2013-10-08 DIAGNOSIS — L958 Other vasculitis limited to the skin: Secondary | ICD-10-CM

## 2013-10-08 DIAGNOSIS — M318 Other specified necrotizing vasculopathies: Secondary | ICD-10-CM

## 2013-10-08 LAB — CBC WITH DIFFERENTIAL/PLATELET
BASOS ABS: 0 10*3/uL (ref 0.0–0.1)
BASOS PCT: 0 % (ref 0–1)
EOS ABS: 0.2 10*3/uL (ref 0.0–0.7)
EOS PCT: 2 % (ref 0–5)
HEMATOCRIT: 38 % (ref 36.0–46.0)
Hemoglobin: 12.3 g/dL (ref 12.0–15.0)
Lymphocytes Relative: 35 % (ref 12–46)
Lymphs Abs: 3.6 10*3/uL (ref 0.7–4.0)
MCH: 24.9 pg — AB (ref 26.0–34.0)
MCHC: 32.4 g/dL (ref 30.0–36.0)
MCV: 76.9 fL — AB (ref 78.0–100.0)
MONO ABS: 0.6 10*3/uL (ref 0.1–1.0)
Monocytes Relative: 6 % (ref 3–12)
Neutro Abs: 5.9 10*3/uL (ref 1.7–7.7)
Neutrophils Relative %: 57 % (ref 43–77)
Platelets: 372 10*3/uL (ref 150–400)
RBC: 4.94 MIL/uL (ref 3.87–5.11)
RDW: 17.8 % — AB (ref 11.5–15.5)
WBC: 10.3 10*3/uL (ref 4.0–10.5)

## 2013-10-08 MED ORDER — VALSARTAN 160 MG PO TABS: 160.0000 mg | ORAL_TABLET | Freq: Every day | ORAL | Status: DC

## 2013-10-09 ENCOUNTER — Other Ambulatory Visit: Payer: Self-pay | Admitting: Family Medicine

## 2013-10-09 ENCOUNTER — Encounter: Payer: Self-pay | Admitting: Family Medicine

## 2013-10-09 NOTE — Telephone Encounter (Signed)
Medication refilled per protocol. 

## 2013-10-19 ENCOUNTER — Encounter: Payer: Self-pay | Admitting: Family Medicine

## 2013-11-16 ENCOUNTER — Encounter: Payer: Self-pay | Admitting: Family Medicine

## 2014-02-03 ENCOUNTER — Other Ambulatory Visit: Payer: Self-pay | Admitting: Family Medicine

## 2014-03-11 ENCOUNTER — Ambulatory Visit (INDEPENDENT_AMBULATORY_CARE_PROVIDER_SITE_OTHER): Payer: Medicare Other | Admitting: *Deleted

## 2014-03-11 DIAGNOSIS — Z23 Encounter for immunization: Secondary | ICD-10-CM

## 2014-03-11 NOTE — Progress Notes (Signed)
Patient ID: Victoria Hull, female   DOB: July 30, 1960, 53 y.o.   MRN: [id number] Patient seen in office for Influenza Vaccination.   Tolerated IM administration well.

## 2014-03-11 NOTE — Patient Instructions (Signed)
Thanks

## 2014-04-23 ENCOUNTER — Other Ambulatory Visit: Payer: Self-pay | Admitting: Family Medicine

## 2014-04-25 IMAGING — CT CT ABD-PELV W/O CM
3 of 4 series · 13 of 36 positions shown, 19 images · non-contrast
Comparison: None.

CLINICAL DATA: Bilateral flank/lower abdominal pain,
microhematuria, evaluate for renal stones

CT ABDOMEN AND PELVIS WITHOUT CONTRAST
TECHNIQUE: Multidetector CT imaging of the abdomen and pelvis was
performed following the standard protocol without intravenous
contrast.

[Series 3: renal stone · axial · 0.78mm/px · z∈[-364,-39]mm · 8 of 85 slices shown, 13 images]
[im 10/85  soft-tissue]
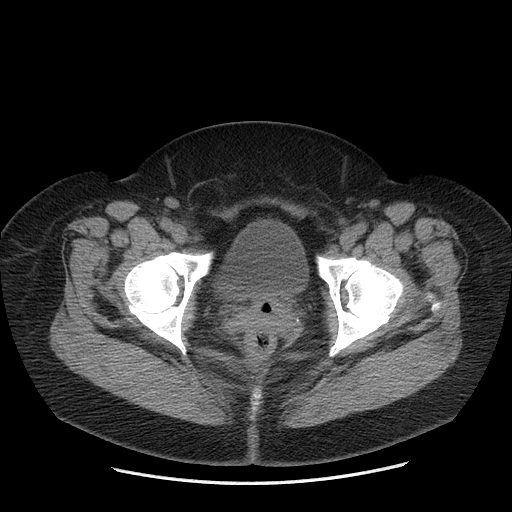
[im 10/85  bone]
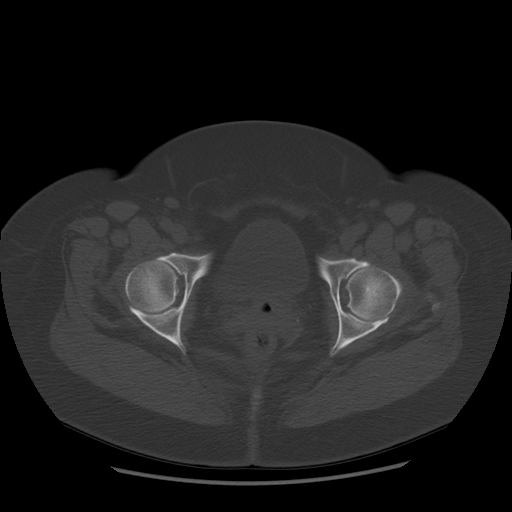
[im 19/85  soft-tissue]
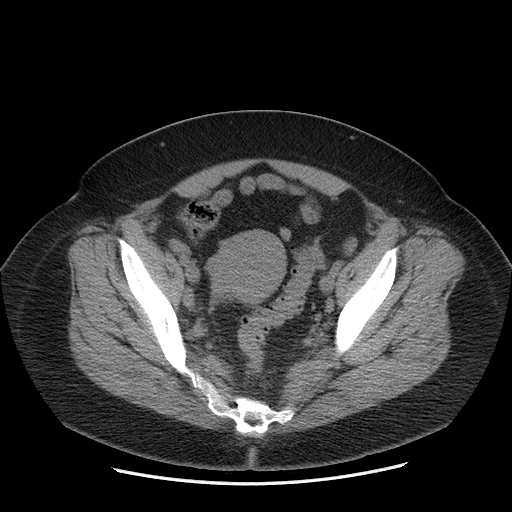
[im 29/85  soft-tissue]
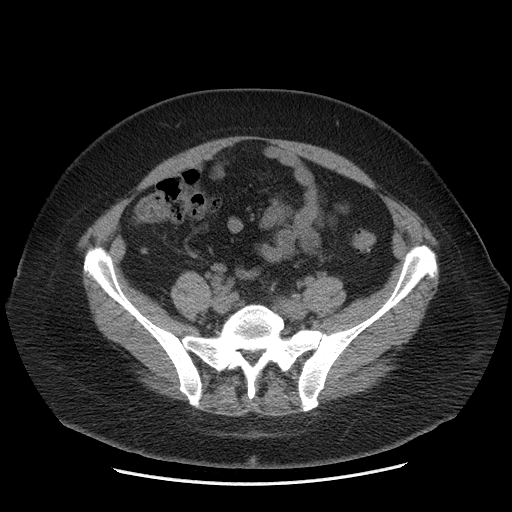
[im 38/85  soft-tissue]
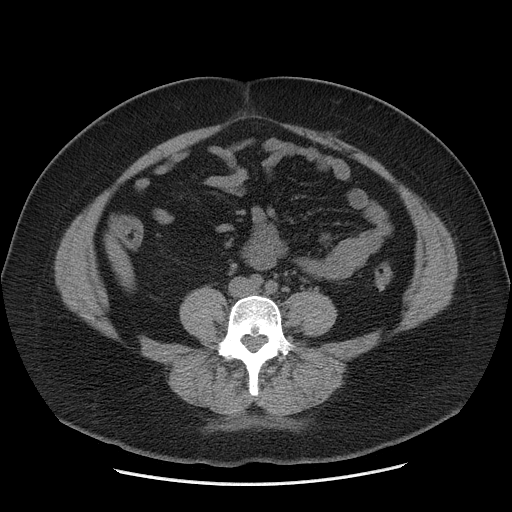
[im 47/85  soft-tissue]
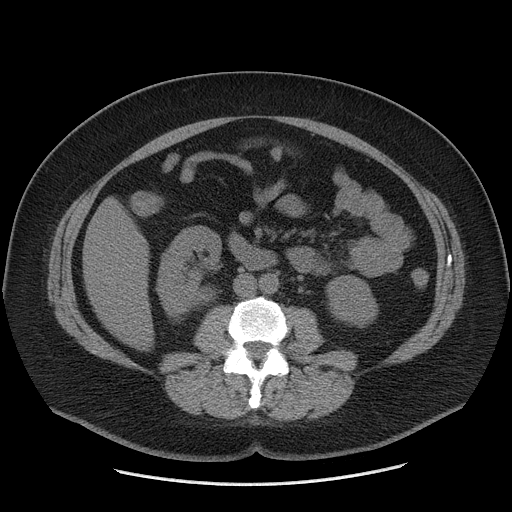
[im 47/85  lung]
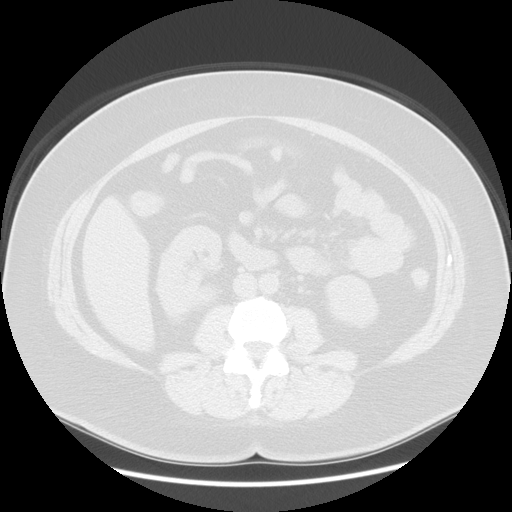
[im 57/85  soft-tissue]
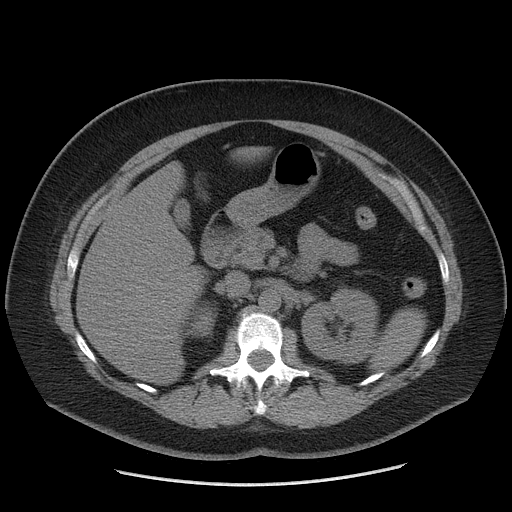
[im 57/85  lung]
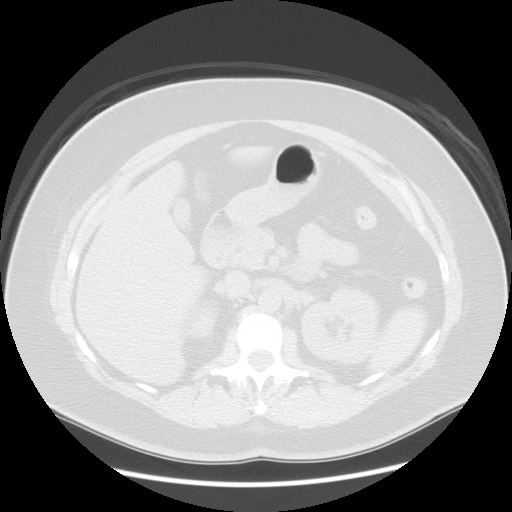
[im 66/85  soft-tissue]
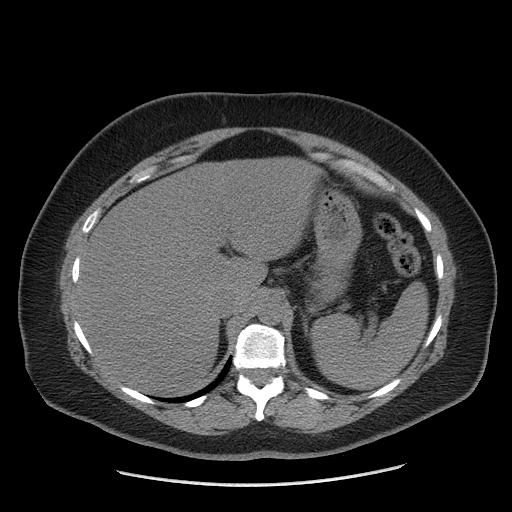
[im 66/85  lung]
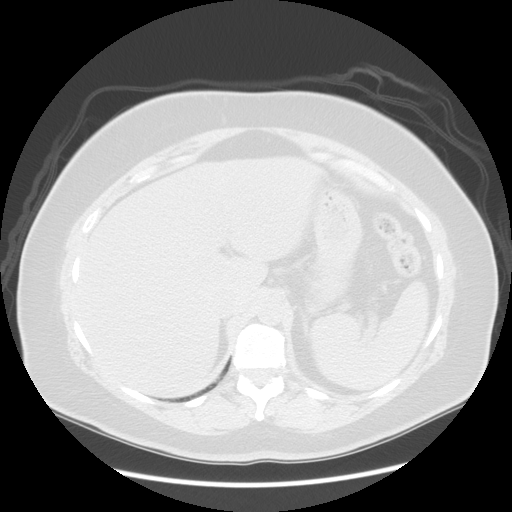
[im 75/85  soft-tissue]
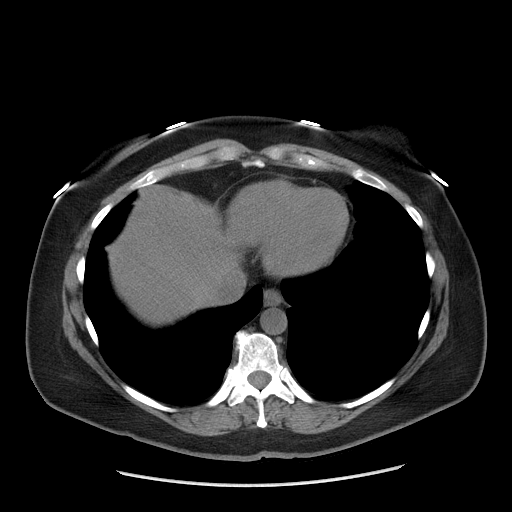
[im 75/85  lung]
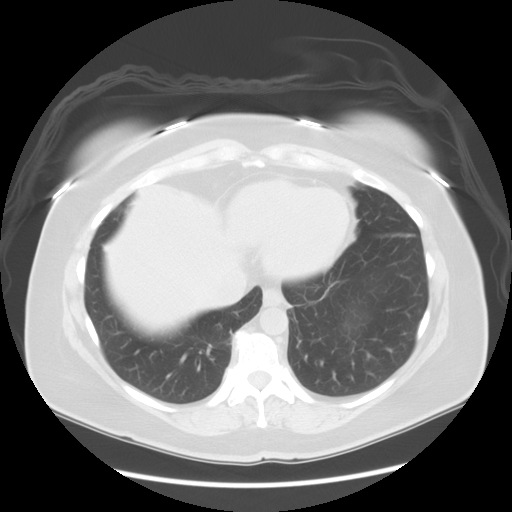

[Series 601: coronal body · coronal · 0.83mm/px · 1 of 128 slices shown, 2 images]
[im 43/128  soft-tissue]
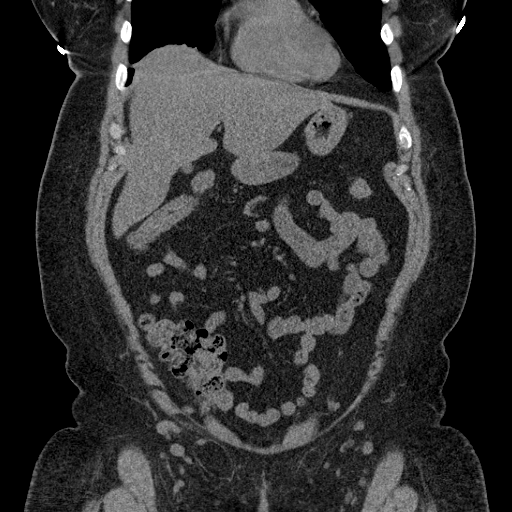
[im 43/128  bone]
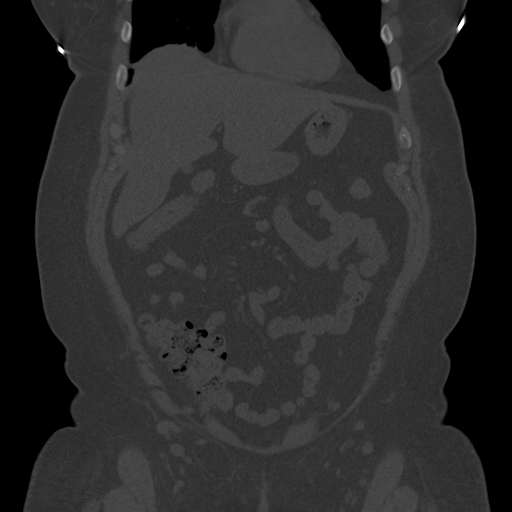

[Series 602: sagittal body · sagittal · 0.83mm/px · 4 of 161 slices shown]
[im 18/161  soft-tissue]
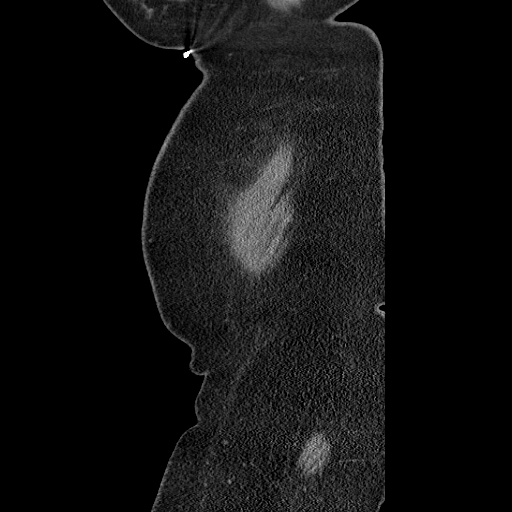
[im 36/161  soft-tissue]
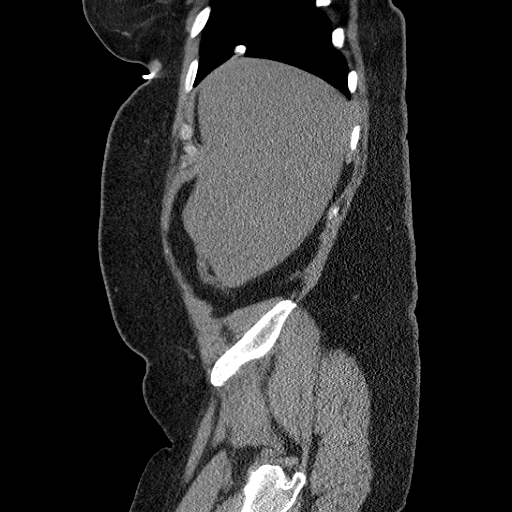
[im 54/161  soft-tissue]
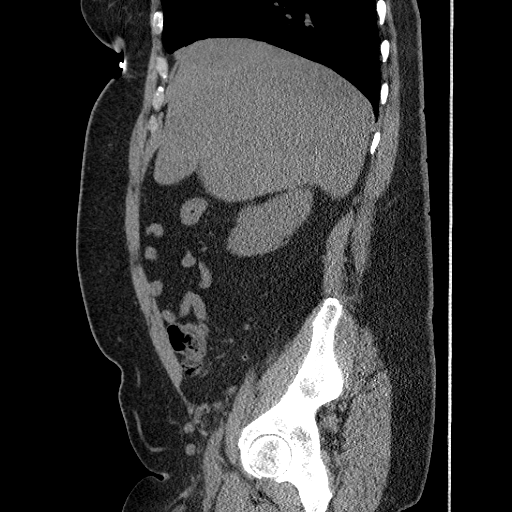
[im 72/161  soft-tissue]
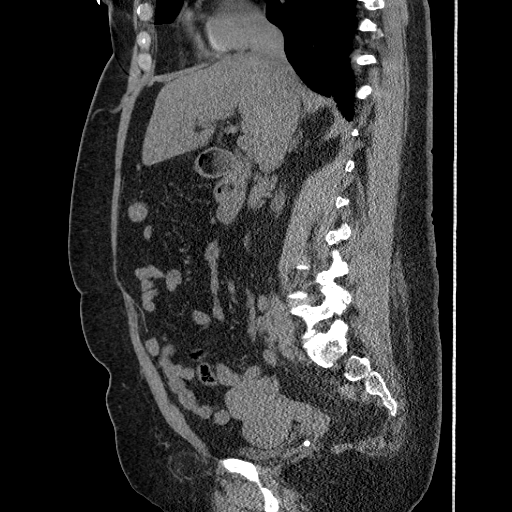

[13 of 36 positions shown; findings below may reference images not displayed]

FINDINGS: 11 mm calcified granuloma at the right lung base (series
4/image 9).

Mild hepatic steatosis.

Unenhanced spleen, pancreas, and adrenal glands are within normal
limits.

Gallbladder is underdistended.  No intrahepatic or extrahepatic
ductal dilatation.

Kidneys are unremarkable.  No renal calculi or hydronephrosis.

Atherosclerotic calcifications of the abdominal aorta and branch
vessels.

No abdominopelvic ascites.

Borderline enlarged iliac chain nodes, including:
--14 mm short-axis right common iliac node (series 3/image 55)
--10 mm short axis right common iliac node (series 3/image 62)
--10 mm short-axis right external iliac node (series 3/image 66)
--11 mm short-axis left external iliac node (series 3/image 69)
--13 mm short-axis left pelvic sidewall node (series 3/image 73)
--11 mm short-axis right deep inguinal node (series 3/image 74)

Possible punctate distal left ureteral calculus (series 3/image
72), equivocal.  Bladder is within normal limits.

Suspected 3.6 cm pedunculated right uterine fibroid (series 3/image
72).  Left ovary is within normal limits.  Right ovary is obscured.

Small right and tiny left inguinal hernias.

Mild degenerative changes of the visualized thoracolumbar spine.
IMPRESSION: Possible punctate distal left ureteral calculus, equivocal.  No
renal or bladder calculi.   No hydronephrosis.

Borderline enlarged bilateral iliac chain lymph nodes, measuring up
to 14 mm, as described above.

Suspected uterine fibroids.

Mild hepatic steatosis.

## 2014-04-29 ENCOUNTER — Encounter: Payer: Self-pay | Admitting: Family Medicine

## 2014-04-29 ENCOUNTER — Ambulatory Visit (INDEPENDENT_AMBULATORY_CARE_PROVIDER_SITE_OTHER): Payer: Medicare Other | Admitting: Family Medicine

## 2014-04-29 VITALS — BP 180/82 | HR 74 | Temp 97.7°F | Resp 18 | Ht 65.0 in

## 2014-04-29 DIAGNOSIS — E038 Other specified hypothyroidism: Secondary | ICD-10-CM

## 2014-04-29 DIAGNOSIS — J208 Acute bronchitis due to other specified organisms: Secondary | ICD-10-CM

## 2014-04-29 DIAGNOSIS — I10 Essential (primary) hypertension: Secondary | ICD-10-CM

## 2014-04-29 DIAGNOSIS — S301XXA Contusion of abdominal wall, initial encounter: Secondary | ICD-10-CM

## 2014-04-29 LAB — COMPLETE METABOLIC PANEL WITH GFR
ALK PHOS: 87 U/L (ref 39–117)
ALT: 13 U/L (ref 0–35)
AST: 38 U/L — AB (ref 0–37)
Albumin: 4.4 g/dL (ref 3.5–5.2)
BILIRUBIN TOTAL: 0.5 mg/dL (ref 0.2–1.2)
BUN: 18 mg/dL (ref 6–23)
CO2: 22 mEq/L (ref 19–32)
CREATININE: 1.29 mg/dL — AB (ref 0.50–1.10)
Calcium: 9.3 mg/dL (ref 8.4–10.5)
Chloride: 100 mEq/L (ref 96–112)
GFR, Est African American: 54 mL/min — ABNORMAL LOW
GFR, Est Non African American: 47 mL/min — ABNORMAL LOW
Glucose, Bld: 115 mg/dL — ABNORMAL HIGH (ref 70–99)
Potassium: 4.4 mEq/L (ref 3.5–5.3)
Sodium: 140 mEq/L (ref 135–145)
Total Protein: 7.3 g/dL (ref 6.0–8.3)

## 2014-04-29 LAB — TSH: TSH: 67.516 u[IU]/mL — ABNORMAL HIGH (ref 0.350–4.500)

## 2014-04-29 LAB — CBC WITH DIFFERENTIAL/PLATELET
Basophils Absolute: 0.3 10*3/uL — ABNORMAL HIGH (ref 0.0–0.1)
Basophils Relative: 3 % — ABNORMAL HIGH (ref 0–1)
Eosinophils Absolute: 0.3 10*3/uL (ref 0.0–0.7)
Eosinophils Relative: 3 % (ref 0–5)
HCT: 33.7 % — ABNORMAL LOW (ref 36.0–46.0)
Hemoglobin: 11.2 g/dL — ABNORMAL LOW (ref 12.0–15.0)
LYMPHS ABS: 1.8 10*3/uL (ref 0.7–4.0)
LYMPHS PCT: 21 % (ref 12–46)
MCH: 27.9 pg (ref 26.0–34.0)
MCHC: 33.2 g/dL (ref 30.0–36.0)
MCV: 84 fL (ref 78.0–100.0)
Monocytes Absolute: 0.4 10*3/uL (ref 0.1–1.0)
Monocytes Relative: 5 % (ref 3–12)
Neutro Abs: 5.9 10*3/uL (ref 1.7–7.7)
Neutrophils Relative %: 68 % (ref 43–77)
PLATELETS: 428 10*3/uL — AB (ref 150–400)
RBC: 4.01 MIL/uL (ref 3.87–5.11)
RDW: 17.5 % — ABNORMAL HIGH (ref 11.5–15.5)
WBC: 8.7 10*3/uL (ref 4.0–10.5)

## 2014-04-29 MED ORDER — CLONIDINE HCL 0.1 MG PO TABS: 0.2000 mg | ORAL_TABLET | Freq: Two times a day (BID) | ORAL | Status: DC

## 2014-04-29 MED ORDER — VALSARTAN 160 MG PO TABS: ORAL_TABLET | ORAL | Status: DC

## 2014-04-29 MED ORDER — AMLODIPINE BESYLATE 10 MG PO TABS: 10.0000 mg | ORAL_TABLET | Freq: Every day | ORAL | Status: DC

## 2014-04-29 NOTE — Progress Notes (Signed)
Subjective:    Patient ID: Victoria Hull, female    DOB: 02/02/1961, 53 y.o.   MRN: [id number]  HPI Patient was out of [place] last week when she developed a sore throat and a cough productive of green sputum. She was seen in urgent care who referred her to the hospital when they noticed a large hematoma in her abdominal wall. Patient had a CT scan at the hospital which confirmed an abdominal wall hematoma. Today there is a large bruise on her right abdomen. However the bruise is starting to turn green and brown indicating old bruising. On examination today there is a palpable hematoma in the right side of the rectus abdominis muscle. It is approximately 3" x 4" on palpation. Patient states that this is slowly starting to get smaller. She states that it is definitely not enlarging. She is not currently taking any blood thinners. She is "her aspirin. Unfortunately her blood pressure is extremely high today at 180/82. She denies any chest pain shortness of breath or dyspnea on exertion. Her cough has improved. Her sore throat has improved. She states that she feels like her bronchitis is improving. She is also overdue to recheck a TSH for her hypothyroidism. Past Medical History  Diagnosis Date  . Hypertension   . Hyperlipidemia   . Chest pain   . Tachycardia   . Hypothyroidism   . Relapsing polychondritis   . Asthma   . DDD (degenerative disc disease), lumbosacral   . 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 Tubal ligation  1986  . Knee surgery      RIGHT KNEE  . Carpal tunnel release      bilateral  . [place] echocardiography  10/03/2005    EF 55%   Current Outpatient Prescriptions on File Prior to Visit  Medication Sig Dispense Refill  . albuterol (PROAIR HFA) 108 (90 BASE) MCG/ACT inhaler Inhale 2 puffs into the lungs every 6 (six) hours as needed.  1 Inhaler  6  . aspirin 81 MG tablet Take 81 mg by mouth daily.        .  Calcium Carbonate-Vitamin D (CALCIUM + D PO) Take by mouth once.      . cloNIDine (CATAPRES) 0.1 MG tablet TAKE ONE TABLET BY MOUTH TWICE DAILY  60 tablet  11  . hydroxychloroquine (PLAQUENIL) 200 MG tablet Take 200 mg by mouth daily.      Marland Kitchen. levothyroxine (SYNTHROID, LEVOTHROID) 137 MCG tablet TAKE ONE TABLET BY MOUTH ONCE DAILY BEFORE BREAKFAST  90 tablet  3  . Multiple Vitamin (MULTIVITAMIN) capsule Take 1 capsule by mouth daily.      . Multiple Vitamins-Minerals (VISION FORMULA PO) Take 1 tablet by mouth daily.       Marland Kitchen. omeprazole (PRILOSEC) 20 MG capsule TAKE TWO CAPSULES BY MOUTH ONCE DAILY  60 capsule  11  . pravastatin (PRAVACHOL) 40 MG tablet TAKE ONE TABLET BY MOUTH EVERY DAY AT BEDTIME  30 tablet  5  . predniSONE (DELTASONE) 10 MG tablet Take 5 mg by mouth daily with breakfast.        No current facility-administered medications on file prior to visit.   Allergies  Allergen Reactions  . Cats Claw [Uncaria Tomentosa (Cats Claw)]     cats  . Niaspan [Niacin] Other (See Comments)    Myalgias in legs  . Penicillins    History   Social History  . Marital Status: Married  Spouse Name: N/A    Number of Children: 5  . Years of Education: N/A   Occupational History  . Not on file.   Social History Main Topics  . Smoking status: Never Smoker   . Smokeless tobacco: Never Used  . Alcohol Use: No  . Drug Use: No  . Sexual Activity: Not on file   Other Topics Concern  . Not on file   Social History Narrative  . No narrative on file      Review of Systems  All other systems reviewed and are negative.      Objective:   Physical Exam  Vitals reviewed. Constitutional: She appears well-developed and well-nourished.  HENT:  Right Ear: External ear normal.  Left Ear: External ear normal.  Nose: Nose normal.  Mouth/Throat: Oropharynx is clear and moist. No oropharyngeal exudate.  Neck: Neck supple.  Cardiovascular: Normal rate, regular rhythm and normal heart  sounds.   No murmur heard. Pulmonary/Chest: Effort normal and breath sounds normal. No respiratory distress. She has no wheezes. She has no rales. She exhibits no tenderness.  Abdominal: Soft. Bowel sounds are normal. She exhibits mass. She exhibits no distension. There is tenderness. There is no rebound and no guarding.          Assessment & Plan:  Benign essential HTN - Plan: amLODipine (NORVASC) 10 MG tablet, COMPLETE METABOLIC PANEL WITH GFR, CBC with Differential, cloNIDine (CATAPRES) 0.1 MG tablet, valsartan (DIOVAN) 160 MG tablet  Other specified hypothyroidism - Plan: TSH  Acute bronchitis due to other specified organisms  Hematoma of abdominal wall, initial encounter  Add Norvasc 10 mg by mouth daily. Recheck blood pressure in 2 weeks. If still elevated at that point, I would obtain a renal artery ultrasound to rule out renal artery stenosis. I will also check a TSH today. I believe the patient's bronchitis is likely viral and is slowly resolving. At the present time there is no indication for prednisone or doxycycline. The patient does have a large hematoma in her abdominal wall. However this seems to be slowly improving. I will recheck this in 2 weeks. At the present time there is no sign of enlargement. Her pain is well controlled. Therefore I do not feel that she needs to have an abdominal scan at this time. Recheck in 2 weeks

## 2014-05-05 ENCOUNTER — Encounter: Payer: Self-pay | Admitting: Family Medicine

## 2014-05-13 ENCOUNTER — Ambulatory Visit (INDEPENDENT_AMBULATORY_CARE_PROVIDER_SITE_OTHER): Payer: Medicare Other | Admitting: Family Medicine

## 2014-05-13 ENCOUNTER — Encounter: Payer: Self-pay | Admitting: Family Medicine

## 2014-05-13 VITALS — BP 152/100 | HR 78 | Temp 97.6°F | Resp 18 | Ht 65.0 in

## 2014-05-13 DIAGNOSIS — I10 Essential (primary) hypertension: Secondary | ICD-10-CM

## 2014-05-13 NOTE — Progress Notes (Signed)
Subjective:    Patient ID: Victoria Hull, female    DOB: 1960/08/29, 53 y.o.   MRN: [id number]  HPI 04/29/14 Patient was out of state last week when she developed a sore throat and a cough productive of green sputum. She was seen in urgent care who referred her to the hospital when they noticed a large hematoma in her abdominal wall. Patient had a CT scan at the hospital which confirmed an abdominal wall hematoma. Today there is a large bruise on her right abdomen. However the bruise is starting to turn green and brown indicating old bruising. On examination today there is a palpable hematoma in the right side of the rectus abdominis muscle. It is approximately 3" x 4" on palpation. Patient states that this is slowly starting to get smaller. She states that it is definitely not enlarging. She is not currently taking any blood thinners. She is "her aspirin. Unfortunately her blood pressure is extremely high today at 180/82. She denies any chest pain shortness of breath or dyspnea on exertion. Her cough has improved. Her sore throat has improved. She states that she feels like her bronchitis is improving. She is also overdue to recheck a TSH for her hypothyroidism.   At that time, my plan was: Add Norvasc 10 mg by mouth daily. Recheck blood pressure in 2 weeks. If still elevated at that point, I would obtain a renal artery ultrasound to rule out renal artery stenosis. I will also check a TSH today. I believe the patient's bronchitis is likely viral and is slowly resolving. At the present time there is no indication for prednisone or doxycycline. The patient does have a large hematoma in her abdominal wall. However this seems to be slowly improving. I will recheck this in 2 weeks. At the present time there is no sign of enlargement. Her pain is well controlled. Therefore I do not feel that she needs to have an abdominal scan at this time. Recheck in 2 weeks 05/13/14 Patient's blood pressure is better  today. Her systolic blood pressure has dropped 30 points. Her diastolic blood pressure remains significantly elevated. Patient has been checking her blood pressure at home however and states that her blood pressure has been ranging in the 140s over 80s. She continues to have a mild cough. The cough is nonproductive and she denies any fever. Past Medical History  Diagnosis Date  . Hypertension   . Hyperlipidemia   . Chest pain   . Tachycardia   . Hypothyroidism   . Relapsing polychondritis   . Asthma   . DDD (degenerative disc disease), lumbosacral   . 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 Tubal ligation  1986  . Knee surgery      RIGHT KNEE  . Carpal tunnel release      bilateral  . [place] echocardiography  10/03/2005    EF 55%   Current Outpatient Prescriptions on File Prior to Visit  Medication Sig Dispense Refill  . albuterol (PROAIR HFA) 108 (90 BASE) MCG/ACT inhaler Inhale 2 puffs into the lungs every 6 (six) hours as needed. 1 Inhaler 6  . amLODipine (NORVASC) 10 MG tablet Take 1 tablet (10 mg total) by mouth daily. 90 tablet 3  . aspirin 81 MG tablet Take 81 mg by mouth daily.      . Calcium Carbonate-Vitamin D (CALCIUM + D PO) Take by mouth once.    . cloNIDine (CATAPRES) 0.1  MG tablet TAKE ONE TABLET BY MOUTH TWICE DAILY 60 tablet 11  . cloNIDine (CATAPRES) 0.1 MG tablet Take 2 tablets (0.2 mg total) by mouth 2 (two) times daily. 120 tablet 3  . hydroxychloroquine (PLAQUENIL) 200 MG tablet Take 200 mg by mouth daily.    Marland Kitchen. levothyroxine (SYNTHROID, LEVOTHROID) 137 MCG tablet TAKE ONE TABLET BY MOUTH ONCE DAILY BEFORE BREAKFAST 90 tablet 3  . Multiple Vitamin (MULTIVITAMIN) capsule Take 1 capsule by mouth daily.    . Multiple Vitamins-Minerals (VISION FORMULA PO) Take 1 tablet by mouth daily.     Marland Kitchen. omeprazole (PRILOSEC) 20 MG capsule TAKE TWO CAPSULES BY MOUTH ONCE DAILY 60 capsule 11  . pravastatin (PRAVACHOL) 40 MG  tablet TAKE ONE TABLET BY MOUTH EVERY DAY AT BEDTIME 30 tablet 5  . predniSONE (DELTASONE) 10 MG tablet Take 5 mg by mouth daily with breakfast.     . valsartan (DIOVAN) 160 MG tablet TAKE ONE TABLET BY MOUTH ONCE DAILY 30 tablet 11   No current facility-administered medications on file prior to visit.   Allergies  Allergen Reactions  . Cats Claw [Uncaria Tomentosa (Cats Claw)]     cats  . Niaspan [Niacin] Other (See Comments)    Myalgias in legs  . Penicillins    History   Social History  . Marital Status: Married    Spouse Name: N/A    Number of Children: 5  . Years of Education: N/A   Occupational History  . Not on file.   Social History Main Topics  . Smoking status: Never Smoker   . Smokeless tobacco: Never Used  . Alcohol Use: No  . Drug Use: No  . Sexual Activity: Not on file   Other Topics Concern  . Not on file   Social History Narrative      Review of Systems  All other systems reviewed and are negative.      Objective:   Physical Exam  Constitutional: She appears well-developed and well-nourished.  Cardiovascular: Normal rate, regular rhythm and normal heart sounds.   Pulmonary/Chest: Effort normal and breath sounds normal. She has no wheezes. She has no rales.  Abdominal: Soft. Bowel sounds are normal.  Musculoskeletal: She exhibits edema.  Vitals reviewed.         Assessment & Plan:  Benign essential HTN  Blood pressure has improved however it is still elevated. The patient does not want to proceed with an ultrasound to evaluate for renal artery stenosis. Furthermore she does not want to add more medication. She would like to give the medication a longer time and recheck her blood pressure when she returns to have her thyroid rechecked in December. I have recommended that she try Symbicort 160/4.5, 2 puffs inhaled twice a day for asthma/relapsing polychondritis which I believe is causing her cough.

## 2014-06-07 ENCOUNTER — Encounter: Payer: Self-pay | Admitting: Family Medicine

## 2014-06-10 ENCOUNTER — Other Ambulatory Visit: Payer: Medicare Other

## 2014-06-10 DIAGNOSIS — E039 Hypothyroidism, unspecified: Secondary | ICD-10-CM

## 2014-06-10 LAB — TSH: TSH: 1.607 u[IU]/mL (ref 0.350–4.500)

## 2014-09-19 ENCOUNTER — Encounter: Payer: Self-pay | Admitting: Family Medicine

## 2014-09-20 ENCOUNTER — Other Ambulatory Visit: Payer: Self-pay | Admitting: Family Medicine

## 2014-09-20 MED ORDER — HYDROCODONE-HOMATROPINE 5-1.5 MG/5ML PO SYRP: 5.0000 mL | ORAL_SOLUTION | Freq: Three times a day (TID) | ORAL | Status: DC | PRN

## 2014-10-25 ENCOUNTER — Other Ambulatory Visit: Payer: Self-pay | Admitting: Family Medicine

## 2014-10-25 NOTE — Telephone Encounter (Signed)
Medication refilled per protocol. 

## 2015-02-23 ENCOUNTER — Other Ambulatory Visit: Payer: Self-pay | Admitting: Family Medicine

## 2015-03-22 ENCOUNTER — Ambulatory Visit (INDEPENDENT_AMBULATORY_CARE_PROVIDER_SITE_OTHER): Payer: Medicare Other | Admitting: Family Medicine

## 2015-03-22 DIAGNOSIS — Z23 Encounter for immunization: Secondary | ICD-10-CM

## 2015-05-23 ENCOUNTER — Other Ambulatory Visit: Payer: Self-pay | Admitting: Family Medicine

## 2015-05-24 ENCOUNTER — Encounter: Payer: Self-pay | Admitting: Family Medicine

## 2015-05-24 NOTE — Telephone Encounter (Signed)
Medication refill for one time only.  Patient needs to be seen.  Letter sent for patient to call and schedule.  Pt has not been seen in over 1 year. 

## 2015-06-21 ENCOUNTER — Other Ambulatory Visit: Payer: Self-pay | Admitting: Family Medicine

## 2015-07-19 ENCOUNTER — Other Ambulatory Visit: Payer: Self-pay | Admitting: Family Medicine

## 2015-07-19 NOTE — Telephone Encounter (Signed)
Pt has not been seen in over 1 year.  Has been sent letter to schedule appt and has not.  Refill denied

## 2015-07-20 ENCOUNTER — Other Ambulatory Visit: Payer: Self-pay | Admitting: Family Medicine

## 2015-07-20 ENCOUNTER — Encounter: Payer: Self-pay | Admitting: Family Medicine

## 2015-07-22 ENCOUNTER — Other Ambulatory Visit: Payer: Self-pay | Admitting: Family Medicine

## 2015-07-22 NOTE — Telephone Encounter (Signed)
Medication filled x1 with no refills.   Requires office visit before any further refills can be given.   Appointment scheduled for 07/28/2015.

## 2015-07-26 ENCOUNTER — Other Ambulatory Visit: Payer: Medicare Other

## 2015-07-26 DIAGNOSIS — I10 Essential (primary) hypertension: Secondary | ICD-10-CM

## 2015-07-26 DIAGNOSIS — E039 Hypothyroidism, unspecified: Secondary | ICD-10-CM

## 2015-07-26 DIAGNOSIS — E785 Hyperlipidemia, unspecified: Secondary | ICD-10-CM

## 2015-07-26 LAB — CBC WITH DIFFERENTIAL/PLATELET
BASOS ABS: 0 10*3/uL (ref 0.0–0.1)
Basophils Relative: 0 % (ref 0–1)
EOS ABS: 0.2 10*3/uL (ref 0.0–0.7)
EOS PCT: 3 % (ref 0–5)
HEMATOCRIT: 39.4 % (ref 36.0–46.0)
Hemoglobin: 12.7 g/dL (ref 12.0–15.0)
LYMPHS ABS: 2.2 10*3/uL (ref 0.7–4.0)
Lymphocytes Relative: 32 % (ref 12–46)
MCH: 27.1 pg (ref 26.0–34.0)
MCHC: 32.2 g/dL (ref 30.0–36.0)
MCV: 84 fL (ref 78.0–100.0)
MONO ABS: 0.5 10*3/uL (ref 0.1–1.0)
MONOS PCT: 7 % (ref 3–12)
MPV: 9.1 fL (ref 8.6–12.4)
Neutro Abs: 4 10*3/uL (ref 1.7–7.7)
Neutrophils Relative %: 58 % (ref 43–77)
PLATELETS: 374 10*3/uL (ref 150–400)
RBC: 4.69 MIL/uL (ref 3.87–5.11)
RDW: 15.3 % (ref 11.5–15.5)
WBC: 6.9 10*3/uL (ref 4.0–10.5)

## 2015-07-26 LAB — COMPLETE METABOLIC PANEL WITH GFR
ALT: 14 U/L (ref 6–29)
AST: 15 U/L (ref 10–35)
Albumin: 4.1 g/dL (ref 3.6–5.1)
Alkaline Phosphatase: 99 U/L (ref 33–130)
BILIRUBIN TOTAL: 0.4 mg/dL (ref 0.2–1.2)
BUN: 14 mg/dL (ref 7–25)
CHLORIDE: 105 mmol/L (ref 98–110)
CO2: 30 mmol/L (ref 20–31)
CREATININE: 0.88 mg/dL (ref 0.50–1.05)
Calcium: 9.2 mg/dL (ref 8.6–10.4)
GFR, EST AFRICAN AMERICAN: 85 mL/min (ref >=60)
GFR, Est Non African American: 74 mL/min (ref >=60)
Glucose, Bld: 89 mg/dL (ref 70–99)
Potassium: 4.4 mmol/L (ref 3.5–5.3)
Sodium: 145 mmol/L (ref 135–146)
TOTAL PROTEIN: 7.3 g/dL (ref 6.1–8.1)

## 2015-07-26 LAB — LIPID PANEL
Cholesterol: 228 mg/dL — ABNORMAL HIGH (ref 125–200)
HDL: 32 mg/dL — AB (ref >=46)
LDL CALC: 152 mg/dL — AB (ref <130)
Total CHOL/HDL Ratio: 7.1 Ratio — ABNORMAL HIGH (ref <=5.0)
Triglycerides: 219 mg/dL — ABNORMAL HIGH (ref <150)
VLDL: 44 mg/dL — ABNORMAL HIGH (ref <30)

## 2015-07-26 LAB — TSH: TSH: 0.444 u[IU]/mL (ref 0.350–4.500)

## 2015-07-28 ENCOUNTER — Encounter: Payer: Self-pay | Admitting: Family Medicine

## 2015-07-28 ENCOUNTER — Ambulatory Visit (INDEPENDENT_AMBULATORY_CARE_PROVIDER_SITE_OTHER): Payer: Medicare Other | Admitting: Family Medicine

## 2015-07-28 VITALS — BP 146/98 | HR 86 | Temp 98.3°F | Resp 18 | Ht 65.0 in | Wt 174.0 lb

## 2015-07-28 DIAGNOSIS — Z0000 Encounter for general adult medical examination without abnormal findings: Secondary | ICD-10-CM

## 2015-07-28 DIAGNOSIS — I10 Essential (primary) hypertension: Secondary | ICD-10-CM

## 2015-07-28 DIAGNOSIS — E785 Hyperlipidemia, unspecified: Secondary | ICD-10-CM

## 2015-07-28 MED ORDER — ROSUVASTATIN CALCIUM 40 MG PO TABS: 40.0000 mg | ORAL_TABLET | Freq: Every day | ORAL | Status: DC

## 2015-07-28 NOTE — Progress Notes (Signed)
Subjective:    Patient ID: Victoria Hull, female    DOB: September 18, 1960, 55 y.o.   MRN: [id number]  HPI  patient is a very pleasant 55 year old white female who is here today for complete physical exam. She is overdue for mammogram but she states she will schedule this. She refuses a colonoscopy but she will consent to cologuard rectal cancer screening.   She refuses a Pap smear due to previously poor experiences she had. She states that she will consider letting me do it but she refuses that at the present time. Immunizations are up-to-date. She's had Pneumovax 23, Prevnar 13, and the flu shot. She had a bone density test in 2010. I would repeat this when she turns 58 due to her history of prednisone use. Otherwise her preventative care is up-to-date. Unfortunately her blood pressure slightly elevated today at 146/98. Her cholesterol is also elevated. Her most recent lab work as listed below. We discussed risk factors for hepatitis C screening at the present time she declines hepatitis C screening. Lab on 07/26/2015  Component Date Value Ref Range Status  . Sodium 07/26/2015 145  135 - 146 mmol/L Final  . Potassium 07/26/2015 4.4  3.5 - 5.3 mmol/L Final  . Chloride 07/26/2015 105  98 - 110 mmol/L Final  . CO2 07/26/2015 30  20 - 31 mmol/L Final  . Glucose, Bld 07/26/2015 89  70 - 99 mg/dL Final  . BUN 07/26/2015 14  7 - 25 mg/dL Final  . Creat 07/26/2015 0.88  0.50 - 1.05 mg/dL Final  . Total Bilirubin 07/26/2015 0.4  0.2 - 1.2 mg/dL Final  . Alkaline Phosphatase 07/26/2015 99  33 - 130 U/L Final  . AST 07/26/2015 15  10 - 35 U/L Final  . ALT 07/26/2015 14  6 - 29 U/L Final  . Total Protein 07/26/2015 7.3  6.1 - 8.1 g/dL Final  . Albumin 07/26/2015 4.1  3.6 - 5.1 g/dL Final  . Calcium 07/26/2015 9.2  8.6 - 10.4 mg/dL Final  . GFR, Est African American 07/26/2015 85  >=60 mL/min Final  . GFR, Est Non African American 07/26/2015 74  >=60 mL/min Final   Comment:   The estimated GFR is a  calculation valid for adults (>=45 years old) that uses the CKD-EPI algorithm to adjust for age and sex. It is   not to be used for children, pregnant women, hospitalized patients,    patients on dialysis, or with rapidly changing kidney function. According to the NKDEP, eGFR >89 is normal, 60-89 shows mild impairment, 30-59 shows moderate impairment, 15-29 shows severe impairment and <15 is ESRD.     . WBC 07/26/2015 6.9  4.0 - 10.5 K/uL Final  . RBC 07/26/2015 4.69  3.87 - 5.11 MIL/uL Final  . Hemoglobin 07/26/2015 12.7  12.0 - 15.0 g/dL Final  . HCT 07/26/2015 39.4  36.0 - 46.0 % Final  . MCV 07/26/2015 84.0  78.0 - 100.0 fL Final  . MCH 07/26/2015 27.1  26.0 - 34.0 pg Final  . MCHC 07/26/2015 32.2  30.0 - 36.0 g/dL Final  . RDW 07/26/2015 15.3  11.5 - 15.5 % Final  . Platelets 07/26/2015 374  150 - 400 K/uL Final  . MPV 07/26/2015 9.1  8.6 - 12.4 fL Final  . Neutrophils Relative % 07/26/2015 58  43 - 77 % Final  . Neutro Abs 07/26/2015 4.0  1.7 - 7.7 K/uL Final  . Lymphocytes Relative 07/26/2015 32  12 - 46 % Final  .  Lymphs Abs 07/26/2015 2.2  0.7 - 4.0 K/uL Final  . Monocytes Relative 07/26/2015 7  3 - 12 % Final  . Monocytes Absolute 07/26/2015 0.5  0.1 - 1.0 K/uL Final  . Eosinophils Relative 07/26/2015 3  0 - 5 % Final  . Eosinophils Absolute 07/26/2015 0.2  0.0 - 0.7 K/uL Final  . Basophils Relative 07/26/2015 0  0 - 1 % Final  . Basophils Absolute 07/26/2015 0.0  0.0 - 0.1 K/uL Final  . Smear Review 07/26/2015 Criteria for review not met   Final  . Cholesterol 07/26/2015 228* 125 - 200 mg/dL Final  . Triglycerides 07/26/2015 219* <150 mg/dL Final  . HDL 07/26/2015 32* >=46 mg/dL Final  . Total CHOL/HDL Ratio 07/26/2015 7.1* <=5.0 Ratio Final  . VLDL 07/26/2015 44* <30 mg/dL Final  . LDL Cholesterol 07/26/2015 152* <130 mg/dL Final   Comment:   Total Cholesterol/HDL Ratio:CHD Risk                        Coronary Heart Disease Risk Table                                         Men       Women          1/2 Average Risk              3.4        3.3              Average Risk              5.0        4.4           2X Average Risk              9.6        7.1           3X Average Risk             23.4       11.0 Use the calculated Patient Ratio above and the CHD Risk table  to determine the patient's CHD Risk.   Marland Kitchen TSH 07/26/2015 0.444  0.350 - 4.500 uIU/mL Final   Past Medical History  Diagnosis Date  . Hypertension   . Hyperlipidemia   . Chest pain   . Tachycardia   . Hypothyroidism   . Relapsing polychondritis   . Asthma   . DDD (degenerative disc disease), lumbosacral   . Interface dermatitis [id number]/8938    Neutrophilic   Past Surgical History  Procedure Laterality Date  . Tubal ligation  1986  . Knee surgery      RIGHT KNEE  . Carpal tunnel release      bilateral  . US echocardiography  10/03/2005    EF 55%   Current Outpatient Prescriptions on File Prior to Visit  Medication Sig Dispense Refill  . albuterol (PROAIR HFA) 108 (90 BASE) MCG/ACT inhaler Inhale 2 puffs into the lungs every 6 (six) hours as needed. 1 Inhaler 6  . Calcium Carbonate-Vitamin D (CALCIUM + D PO) Take by mouth once.    . cloNIDine (CATAPRES) 0.1 MG tablet TAKE ONE TABLET BY MOUTH TWICE DAILY 60 tablet 0  . levothyroxine (SYNTHROID, LEVOTHROID) 137 MCG tablet TAKE ONE TABLET BY MOUTH ONCE DAILY BEFORE BREAKFAST 90 tablet 3  . Multiple  Vitamin (MULTIVITAMIN) capsule Take 1 capsule by mouth daily.    . Multiple Vitamins-Minerals (VISION FORMULA PO) Take 1 tablet by mouth daily.     Marland Kitchen omeprazole (PRILOSEC) 20 MG capsule TAKE TWO CAPSULES BY MOUTH ONCE DAILY 60 capsule 11  . pravastatin (PRAVACHOL) 40 MG tablet TAKE ONE TABLET BY MOUTH AT BEDTIME **NEEDS  OFFICE  VISIT  FOR  FURTHER  REFILLS** 30 tablet 0  . valsartan (DIOVAN) 160 MG tablet TAKE ONE TABLET BY MOUTH ONCE DAILY 30 tablet 0   No current facility-administered medications on file prior to visit.   Allergies    Allergen Reactions  . Cats Claw [Uncaria Tomentosa (Cats Claw)]     cats  . Niaspan [Niacin] Other (See Comments)    Myalgias in legs  . Penicillins    Social History   Social History  . Marital Status: Married    Spouse Name: N/A  . Number of Children: 5  . Years of Education: N/A   Occupational History  . Not on file.   Social History Main Topics  . Smoking status: Never Smoker   . Smokeless tobacco: Never Used  . Alcohol Use: No  . Drug Use: No  . Sexual Activity: Not on file   Other Topics Concern  . Not on file   Social History Narrative   Family History  Problem Relation Age of Onset  . Heart failure Father   . Hyperlipidemia Father     chf  . Diabetes Father   . Parkinson's disease Father   . Heart disease Father     Premature CAD Fam HX  . Emphysema Paternal Aunt   . Asthma Brother   . Asthma Child   . Asthma Grandchild   . Hypertension Mother   . Hypothyroidism Mother   . Cancer Daughter     CERVICAL      Review of Systems  All other systems reviewed and are negative.      Objective:   Physical Exam  Constitutional: She is oriented to person, place, and time. She appears well-developed and well-nourished. No distress.  HENT:  Head: Normocephalic and atraumatic.  Right Ear: External ear normal.  Left Ear: External ear normal.  Nose: Nose normal.  Mouth/Throat: Oropharynx is clear and moist. No oropharyngeal exudate.  Eyes: Conjunctivae and EOM are normal. Pupils are equal, round, and reactive to light. Right eye exhibits no discharge. Left eye exhibits no discharge. No scleral icterus.  Neck: Normal range of motion. Neck supple. No JVD present. No tracheal deviation present. No thyromegaly present.  Cardiovascular: Normal rate, regular rhythm, normal heart sounds and intact distal pulses.  Exam reveals no gallop and no friction rub.   No murmur heard. Pulmonary/Chest: Effort normal and breath sounds normal. No stridor. No respiratory  distress. She has no wheezes. She has no rales. She exhibits no tenderness.  Abdominal: Soft. Bowel sounds are normal. She exhibits no distension and no mass. There is no tenderness. There is no rebound and no guarding.  Musculoskeletal: Normal range of motion. She exhibits no edema or tenderness.  Lymphadenopathy:    She has no cervical adenopathy.  Neurological: She is alert and oriented to person, place, and time. She has normal reflexes. She displays normal reflexes. No cranial nerve deficit. She exhibits normal muscle tone. Coordination normal.  Skin: Skin is warm. No rash noted. She is not diaphoretic. No erythema. No pallor.  Psychiatric: She has a normal mood and affect. Her behavior is normal. Judgment  and thought content normal.  Vitals reviewed.         Assessment & Plan:  Routine general medical examination at a health care facility - Plan: Cologuard, rosuvastatin (CRESTOR) 40 MG tablet  Essential hypertension  HLD (hyperlipidemia)   Patient will schedule her mammogram. She refuses a colonoscopy. She refuses a Pap smear. I will try to arrange for the patient to have cancer screening with the cologuard stool test.   Her blood pressures elevated. Will increase valsartan to 320 mg by mouth daily and recheck blood pressure in one month. Cholesterol is also elevated and therefore I've asked the patient to discontinue pravastatin and replace it with Crestor 40 mg by mouth daily and recheck lab work in 3 months. Immunizations are up-to-date. She does report some dyspnea on exertion and worsening cough. She has a history of asthma and at the present time she is on no maintenance medication. I will have her try Symbicort 160/4.5, 2 puffs inhaled twice daily. If symptoms are not improving , we may want to proceed with cardiology consultation for an echocardiogram. She denies any angina.

## 2015-07-29 ENCOUNTER — Other Ambulatory Visit: Payer: Self-pay

## 2015-07-29 DIAGNOSIS — Z1231 Encounter for screening mammogram for malignant neoplasm of breast: Secondary | ICD-10-CM

## 2015-08-01 ENCOUNTER — Ambulatory Visit
Admission: RE | Admit: 2015-08-01 | Discharge: 2015-08-01 | Disposition: A | Discharge status: Home / Self Care | Payer: Medicare Other | Source: Ambulatory Visit

## 2015-08-01 DIAGNOSIS — Z1231 Encounter for screening mammogram for malignant neoplasm of breast: Secondary | ICD-10-CM

## 2015-08-04 ENCOUNTER — Encounter: Payer: Self-pay | Admitting: Family Medicine

## 2015-08-23 ENCOUNTER — Other Ambulatory Visit: Payer: Self-pay | Admitting: Family Medicine

## 2015-08-26 ENCOUNTER — Encounter: Payer: Self-pay | Admitting: Family Medicine

## 2015-08-31 ENCOUNTER — Encounter: Payer: Self-pay | Admitting: *Deleted

## 2015-09-03 LAB — COLOGUARD: COLOGUARD: NEGATIVE

## 2015-09-09 ENCOUNTER — Encounter: Payer: Self-pay | Admitting: Family Medicine

## 2015-10-27 ENCOUNTER — Other Ambulatory Visit: Payer: Medicare Other

## 2015-10-27 DIAGNOSIS — I10 Essential (primary) hypertension: Secondary | ICD-10-CM

## 2015-10-27 DIAGNOSIS — E785 Hyperlipidemia, unspecified: Secondary | ICD-10-CM

## 2015-10-27 DIAGNOSIS — E039 Hypothyroidism, unspecified: Secondary | ICD-10-CM

## 2015-10-27 LAB — COMPLETE METABOLIC PANEL WITH GFR
ALT: 14 U/L (ref 6–29)
AST: 16 U/L (ref 10–35)
Albumin: 4.2 g/dL (ref 3.6–5.1)
Alkaline Phosphatase: 84 U/L (ref 33–130)
BUN: 15 mg/dL (ref 7–25)
CALCIUM: 9.2 mg/dL (ref 8.6–10.4)
CHLORIDE: 105 mmol/L (ref 98–110)
CO2: 29 mmol/L (ref 20–31)
CREATININE: 0.94 mg/dL (ref 0.50–1.05)
GFR, EST AFRICAN AMERICAN: 78 mL/min (ref >=60)
GFR, EST NON AFRICAN AMERICAN: 68 mL/min (ref >=60)
Glucose, Bld: 110 mg/dL — ABNORMAL HIGH (ref 70–99)
Potassium: 4.3 mmol/L (ref 3.5–5.3)
Sodium: 143 mmol/L (ref 135–146)
Total Bilirubin: 0.3 mg/dL (ref 0.2–1.2)
Total Protein: 6.9 g/dL (ref 6.1–8.1)

## 2015-10-27 LAB — LIPID PANEL
CHOL/HDL RATIO: 2.9 ratio (ref <=5.0)
Cholesterol: 109 mg/dL — ABNORMAL LOW (ref 125–200)
HDL: 38 mg/dL — AB (ref >=46)
LDL Cholesterol: 39 mg/dL (ref <130)
TRIGLYCERIDES: 160 mg/dL — AB (ref <150)
VLDL: 32 mg/dL — ABNORMAL HIGH (ref <30)

## 2015-10-27 LAB — CBC WITH DIFFERENTIAL/PLATELET
BASOS PCT: 0 %
Basophils Absolute: 0 cells/uL (ref 0–200)
EOS ABS: 276 {cells}/uL (ref 15–500)
Eosinophils Relative: 4 %
HEMATOCRIT: 38.8 % (ref 35.0–45.0)
Hemoglobin: 12.5 g/dL (ref 12.0–15.0)
LYMPHS PCT: 29 %
Lymphs Abs: 2001 cells/uL (ref 850–3900)
MCH: 27.5 pg (ref 27.0–33.0)
MCHC: 32.2 g/dL (ref 32.0–36.0)
MCV: 85.5 fL (ref 80.0–100.0)
MONO ABS: 552 {cells}/uL (ref 200–950)
MONOS PCT: 8 %
MPV: 8.6 fL (ref 7.5–12.5)
NEUTROS ABS: 4071 {cells}/uL (ref 1500–7800)
Neutrophils Relative %: 59 %
PLATELETS: 374 10*3/uL (ref 140–400)
RBC: 4.54 MIL/uL (ref 3.80–5.10)
RDW: 14.7 % (ref 11.0–15.0)
WBC: 6.9 10*3/uL (ref 3.8–10.8)

## 2015-10-27 LAB — TSH: TSH: 0.09 mIU/L — ABNORMAL LOW

## 2015-10-28 ENCOUNTER — Encounter: Payer: Self-pay | Admitting: Family Medicine

## 2015-11-02 MED ORDER — LEVOTHYROXINE SODIUM 125 MCG PO TABS: 125.0000 ug | ORAL_TABLET | Freq: Every day | ORAL | Status: DC

## 2015-11-02 NOTE — Telephone Encounter (Signed)
Medication called/sent to requested pharmacy  

## 2015-11-17 ENCOUNTER — Encounter: Payer: Self-pay | Admitting: Family Medicine

## 2015-11-20 ENCOUNTER — Other Ambulatory Visit: Payer: Self-pay | Admitting: Family Medicine

## 2015-12-31 ENCOUNTER — Other Ambulatory Visit: Payer: Self-pay | Admitting: Family Medicine

## 2016-03-15 ENCOUNTER — Other Ambulatory Visit: Payer: Self-pay | Admitting: Family Medicine

## 2016-05-08 ENCOUNTER — Other Ambulatory Visit: Payer: Medicare Other

## 2016-05-11 ENCOUNTER — Other Ambulatory Visit: Payer: Medicare Other

## 2016-05-11 DIAGNOSIS — E785 Hyperlipidemia, unspecified: Secondary | ICD-10-CM

## 2016-05-11 DIAGNOSIS — I10 Essential (primary) hypertension: Secondary | ICD-10-CM

## 2016-05-11 DIAGNOSIS — E039 Hypothyroidism, unspecified: Secondary | ICD-10-CM

## 2016-05-11 LAB — COMPLETE METABOLIC PANEL WITH GFR
ALT: 12 U/L (ref 6–29)
AST: 16 U/L (ref 10–35)
Albumin: 4.1 g/dL (ref 3.6–5.1)
Alkaline Phosphatase: 88 U/L (ref 33–130)
BUN: 16 mg/dL (ref 7–25)
CHLORIDE: 104 mmol/L (ref 98–110)
CO2: 29 mmol/L (ref 20–31)
CREATININE: 0.87 mg/dL (ref 0.50–1.05)
Calcium: 9.5 mg/dL (ref 8.6–10.4)
GFR, Est African American: 86 mL/min (ref >=60)
GFR, Est Non African American: 74 mL/min (ref >=60)
GLUCOSE: 98 mg/dL (ref 70–99)
Potassium: 4.4 mmol/L (ref 3.5–5.3)
SODIUM: 142 mmol/L (ref 135–146)
Total Bilirubin: 0.2 mg/dL (ref 0.2–1.2)
Total Protein: 7.1 g/dL (ref 6.1–8.1)

## 2016-05-11 LAB — LIPID PANEL
Cholesterol: 121 mg/dL (ref <200)
HDL: 29 mg/dL — AB (ref >50)
Total CHOL/HDL Ratio: 4.2 Ratio (ref <5.0)
Triglycerides: 447 mg/dL — ABNORMAL HIGH (ref <150)

## 2016-05-11 LAB — TSH: TSH: 0.98 m[IU]/L

## 2016-05-14 LAB — CBC WITH DIFFERENTIAL/PLATELET
BASOS PCT: 1 %
Basophils Absolute: 90 cells/uL (ref 0–200)
EOS ABS: 270 {cells}/uL (ref 15–500)
Eosinophils Relative: 3 %
HEMATOCRIT: 41.3 % (ref 35.0–45.0)
HEMOGLOBIN: 13 g/dL (ref 12.0–15.0)
LYMPHS ABS: 2790 {cells}/uL (ref 850–3900)
LYMPHS PCT: 31 %
MCH: 27.7 pg (ref 27.0–33.0)
MCHC: 31.5 g/dL — AB (ref 32.0–36.0)
MCV: 87.9 fL (ref 80.0–100.0)
MONO ABS: 360 {cells}/uL (ref 200–950)
MPV: 8.3 fL (ref 7.5–12.5)
Monocytes Relative: 4 %
NEUTROS PCT: 61 %
Neutro Abs: 5490 cells/uL (ref 1500–7800)
Platelets: 366 10*3/uL (ref 140–400)
RBC: 4.7 MIL/uL (ref 3.80–5.10)
RDW: 15 % (ref 11.0–15.0)
WBC: 9 10*3/uL (ref 3.8–10.8)

## 2016-05-15 ENCOUNTER — Encounter: Payer: Self-pay | Admitting: Family Medicine

## 2016-05-17 ENCOUNTER — Other Ambulatory Visit: Payer: Self-pay | Admitting: *Deleted

## 2016-05-17 MED ORDER — FENOFIBRATE 48 MG PO TABS: 48.0000 mg | ORAL_TABLET | Freq: Every day | ORAL | 3 refills | Status: DC

## 2016-06-05 ENCOUNTER — Other Ambulatory Visit: Payer: Self-pay | Admitting: Family Medicine

## 2016-07-23 ENCOUNTER — Encounter: Payer: Self-pay | Admitting: Family Medicine

## 2016-08-01 ENCOUNTER — Other Ambulatory Visit: Payer: Medicare Other

## 2016-08-03 ENCOUNTER — Encounter: Payer: Medicare Other | Admitting: Family Medicine

## 2016-08-14 ENCOUNTER — Other Ambulatory Visit: Payer: Medicare Other

## 2016-08-14 DIAGNOSIS — E785 Hyperlipidemia, unspecified: Secondary | ICD-10-CM

## 2016-08-14 DIAGNOSIS — I10 Essential (primary) hypertension: Secondary | ICD-10-CM

## 2016-08-14 DIAGNOSIS — E039 Hypothyroidism, unspecified: Secondary | ICD-10-CM

## 2016-08-14 LAB — CBC WITH DIFFERENTIAL/PLATELET
BASOS ABS: 0 {cells}/uL (ref 0–200)
Basophils Relative: 0 %
Eosinophils Absolute: 273 cells/uL (ref 15–500)
Eosinophils Relative: 3 %
HEMATOCRIT: 40.8 % (ref 35.0–45.0)
HEMOGLOBIN: 13 g/dL (ref 12.0–15.0)
LYMPHS ABS: 3276 {cells}/uL (ref 850–3900)
Lymphocytes Relative: 36 %
MCH: 27.5 pg (ref 27.0–33.0)
MCHC: 31.9 g/dL — AB (ref 32.0–36.0)
MCV: 86.3 fL (ref 80.0–100.0)
MONO ABS: 728 {cells}/uL (ref 200–950)
MPV: 8.6 fL (ref 7.5–12.5)
Monocytes Relative: 8 %
NEUTROS PCT: 53 %
Neutro Abs: 4823 cells/uL (ref 1500–7800)
Platelets: 414 10*3/uL — ABNORMAL HIGH (ref 140–400)
RBC: 4.73 MIL/uL (ref 3.80–5.10)
RDW: 15 % (ref 11.0–15.0)
WBC: 9.1 10*3/uL (ref 3.8–10.8)

## 2016-08-14 LAB — LIPID PANEL
Cholesterol: 198 mg/dL (ref <200)
HDL: 41 mg/dL — ABNORMAL LOW (ref >50)
LDL CALC: 112 mg/dL — AB (ref <100)
TRIGLYCERIDES: 223 mg/dL — AB (ref <150)
Total CHOL/HDL Ratio: 4.8 Ratio (ref <5.0)
VLDL: 45 mg/dL — AB (ref <30)

## 2016-08-14 LAB — COMPLETE METABOLIC PANEL WITH GFR
ALT: 15 U/L (ref 6–29)
AST: 15 U/L (ref 10–35)
Albumin: 4.2 g/dL (ref 3.6–5.1)
Alkaline Phosphatase: 77 U/L (ref 33–130)
BUN: 13 mg/dL (ref 7–25)
CHLORIDE: 103 mmol/L (ref 98–110)
CO2: 30 mmol/L (ref 20–31)
Calcium: 9.1 mg/dL (ref 8.6–10.4)
Creat: 1.04 mg/dL (ref 0.50–1.05)
GFR, Est African American: 69 mL/min (ref >=60)
GFR, Est Non African American: 60 mL/min (ref >=60)
GLUCOSE: 94 mg/dL (ref 70–99)
POTASSIUM: 3.9 mmol/L (ref 3.5–5.3)
SODIUM: 145 mmol/L (ref 135–146)
Total Bilirubin: 0.3 mg/dL (ref 0.2–1.2)
Total Protein: 7.2 g/dL (ref 6.1–8.1)

## 2016-08-14 LAB — TSH: TSH: 2.39 mIU/L

## 2016-08-16 ENCOUNTER — Ambulatory Visit (INDEPENDENT_AMBULATORY_CARE_PROVIDER_SITE_OTHER): Payer: Medicare Other | Admitting: Family Medicine

## 2016-08-16 ENCOUNTER — Encounter: Payer: Self-pay | Admitting: Family Medicine

## 2016-08-16 VITALS — BP 136/96 | HR 98 | Temp 98.0°F | Resp 18 | Ht 65.0 in | Wt 176.0 lb

## 2016-08-16 DIAGNOSIS — I10 Essential (primary) hypertension: Secondary | ICD-10-CM | POA: Diagnosis not present

## 2016-08-16 DIAGNOSIS — Z0000 Encounter for general adult medical examination without abnormal findings: Secondary | ICD-10-CM

## 2016-08-16 DIAGNOSIS — E7800 Pure hypercholesterolemia, unspecified: Secondary | ICD-10-CM

## 2016-08-16 DIAGNOSIS — R0609 Other forms of dyspnea: Secondary | ICD-10-CM | POA: Diagnosis not present

## 2016-08-16 DIAGNOSIS — E038 Other specified hypothyroidism: Secondary | ICD-10-CM

## 2016-08-16 MED ORDER — VALSARTAN 320 MG PO TABS: 320.0000 mg | ORAL_TABLET | Freq: Every day | ORAL | 3 refills | Status: DC

## 2016-08-16 NOTE — Progress Notes (Signed)
Subjective:    Patient ID: Victoria Hull, female    DOB: 11/06/1960, 56 y.o.   MRN: [id number]  HPI Patient is a very pleasant 56 year old white female who is here today for complete physical exam.  Immunizations are up-to-date. She's had Pneumovax 23, Prevnar 13, and the flu shot. She had a bone density test in 2010. I would repeat this when she turns 58 due to her history of prednisone use.  She has refused pap smears and colonoscopies in the past although she did have cologuard last year.  Last mammogram 08/01/15.  She continues to refuse a colonoscopy. She continues to refuse a Pap smear. She will schedule her mammogram. She would like me to schedule her for a cologuard.  Her blood pressure today is elevated. She states his running even higher at home. She is also been reporting dyspnea on exertion. She has to stop walking up steps to catch her breath. She also been having some chest pain with exertion. She denies any angina at rest. She denies any nausea vomiting or radiation of the chest pain. However the shortness of breath with exertion has gotten to the point that she is concerned and would like to see a cardiologist Appointment on 08/14/2016  Component Date Value Ref Range Status  . Sodium 08/14/2016 145  135 - 146 mmol/L Final  . Potassium 08/14/2016 3.9  3.5 - 5.3 mmol/L Final  . Chloride 08/14/2016 103  98 - 110 mmol/L Final  . CO2 08/14/2016 30  20 - 31 mmol/L Final  . Glucose, Bld 08/14/2016 94  70 - 99 mg/dL Final  . BUN 08/14/2016 13  7 - 25 mg/dL Final  . Creat 08/14/2016 1.04  0.50 - 1.05 mg/dL Final   Comment:   For patients > or = 59 years of age: The upper reference limit for Creatinine is approximately 13% higher for people identified as African-American.     . Total Bilirubin 08/14/2016 0.3  0.2 - 1.2 mg/dL Final  . Alkaline Phosphatase 08/14/2016 77  33 - 130 U/L Final  . AST 08/14/2016 15  10 - 35 U/L Final  . ALT 08/14/2016 15  6 - 29 U/L Final  . Total  Protein 08/14/2016 7.2  6.1 - 8.1 g/dL Final  . Albumin 08/14/2016 4.2  3.6 - 5.1 g/dL Final  . Calcium 08/14/2016 9.1  8.6 - 10.4 mg/dL Final  . GFR, Est African American 08/14/2016 69  >=60 mL/min Final  . GFR, Est Non African American 08/14/2016 60  >=60 mL/min Final  . WBC 08/14/2016 9.1  3.8 - 10.8 K/uL Final  . RBC 08/14/2016 4.73  3.80 - 5.10 MIL/uL Final  . Hemoglobin 08/14/2016 13.0  12.0 - 15.0 g/dL Final  . HCT 08/14/2016 40.8  35.0 - 45.0 % Final  . MCV 08/14/2016 86.3  80.0 - 100.0 fL Final  . MCH 08/14/2016 27.5  27.0 - 33.0 pg Final  . MCHC 08/14/2016 31.9* 32.0 - 36.0 g/dL Final  . RDW 08/14/2016 15.0  11.0 - 15.0 % Final  . Platelets 08/14/2016 414* 140 - 400 K/uL Final  . MPV 08/14/2016 8.6  7.5 - 12.5 fL Final  . Neutro Abs 08/14/2016 4823  1,500 - 7,800 cells/uL Final  . Lymphs Abs 08/14/2016 3276  850 - 3,900 cells/uL Final  . Monocytes Absolute 08/14/2016 728  200 - 950 cells/uL Final  . Eosinophils Absolute 08/14/2016 273  15 - 500 cells/uL Final  . Basophils Absolute 08/14/2016 0  0 -  200 cells/uL Final  . Neutrophils Relative % 08/14/2016 53  % Final  . Lymphocytes Relative 08/14/2016 36  % Final  . Monocytes Relative 08/14/2016 8  % Final  . Eosinophils Relative 08/14/2016 3  % Final  . Basophils Relative 08/14/2016 0  % Final  . Smear Review 08/14/2016 Criteria for review not met   Final  . Cholesterol 08/14/2016 198  <200 mg/dL Final  . Triglycerides 08/14/2016 223* <150 mg/dL Final  . HDL 08/14/2016 41* >50 mg/dL Final  . Total CHOL/HDL Ratio 08/14/2016 4.8  <5.0 Ratio Final  . VLDL 08/14/2016 45* <30 mg/dL Final  . LDL Cholesterol 08/14/2016 112* <100 mg/dL Final  . TSH 08/14/2016 2.39  mIU/L Final   Comment:   Reference Range   > or = 20 Years  0.40-4.50   Pregnancy Range First trimester  0.26-2.66 Second trimester 0.55-2.73 Third trimester  0.43-2.91      Past Medical History:  Diagnosis Date  . Asthma   . Chest pain   . DDD  (degenerative disc disease), lumbosacral   . Hyperlipidemia   . Hypertension   . Hypothyroidism   . 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 Relapsing polychondritis   . Tachycardia    Past Surgical History:  Procedure Laterality Date  . CARPAL TUNNEL RELEASE     bilateral  . KNEE SURGERY     RIGHT KNEE  . TUBAL LIGATION  1986  . US ECHOCARDIOGRAPHY  10/03/2005   EF 55%   Current Outpatient Prescriptions on File Prior to Visit  Medication Sig Dispense Refill  . albuterol (PROAIR HFA) 108 (90 BASE) MCG/ACT inhaler Inhale 2 puffs into the lungs every 6 (six) hours as needed. 1 Inhaler 6  . aspirin 325 MG tablet Take 325 mg by mouth daily.    . Calcium Carbonate-Vitamin D (CALCIUM + D PO) Take by mouth once.    . cloNIDine (CATAPRES) 0.1 MG tablet TAKE ONE TABLET BY MOUTH TWICE DAILY 60 tablet 0  . cloNIDine (CATAPRES) 0.1 MG tablet TAKE ONE TABLET BY MOUTH TWICE DAILY 60 tablet 11  . fenofibrate (TRICOR) 48 MG tablet Take 1 tablet (48 mg total) by mouth daily. 30 tablet 3  . levothyroxine (SYNTHROID, LEVOTHROID) 125 MCG tablet Take 1 tablet (125 mcg total) by mouth daily. 90 tablet 3  . Multiple Vitamin (MULTIVITAMIN) capsule Take 1 capsule by mouth daily.    . Multiple Vitamins-Minerals (VISION FORMULA PO) Take 1 tablet by mouth daily.     Marland Kitchen omeprazole (PRILOSEC) 20 MG capsule TAKE TWO CAPSULES BY MOUTH ONCE DAILY 60 capsule 11  . pravastatin (PRAVACHOL) 40 MG tablet TAKE ONE TABLET BY MOUTH AT BEDTIME **NEEDS  OFFICE  VISIT  FOR  FURTHER  REFILLS** 30 tablet 0  . rosuvastatin (CRESTOR) 40 MG tablet TAKE ONE TABLET BY MOUTH ONCE DAILY 30 tablet 5  . valsartan (DIOVAN) 160 MG tablet TAKE ONE TABLET BY MOUTH ONCE DAILY 30 tablet 0  . valsartan (DIOVAN) 160 MG tablet TAKE ONE TABLET BY MOUTH ONCE DAILY 30 tablet 11   No current facility-administered medications on file prior to visit.    Allergies  Allergen Reactions  . Cats Claw [Uncaria Tomentosa (Cats Claw)]     cats  .  Niaspan [Niacin] Other (See Comments)    Myalgias in legs  . Penicillins    Social History   Social History  . Marital status: Married    Spouse name: N/A  . Number of children: 5  .  Years of education: N/A   Occupational History  . Not on file.   Social History Main Topics  . Smoking status: Never Smoker  . Smokeless tobacco: Never Used  . Alcohol use No  . Drug use: No  . Sexual activity: Not on file   Other Topics Concern  . Not on file   Social History Narrative  . No narrative on file   Family History  Problem Relation Age of Onset  . Heart failure Father   . Hyperlipidemia Father     chf  . Diabetes Father   . Parkinson's disease Father   . Heart disease Father     Premature CAD Fam HX  . Emphysema Paternal Aunt   . Asthma Brother   . Asthma Child   . Asthma Grandchild   . Hypertension Mother   . Hypothyroidism Mother   . Cancer Daughter     CERVICAL      Review of Systems  All other systems reviewed and are negative.      Objective:   Physical Exam  Constitutional: She is oriented to person, place, and time. She appears well-developed and well-nourished. No distress.  HENT:  Head: Normocephalic and atraumatic.  Right Ear: External ear normal.  Left Ear: External ear normal.  Nose: Nose normal.  Mouth/Throat: Oropharynx is clear and moist. No oropharyngeal exudate.  Eyes: Conjunctivae and EOM are normal. Pupils are equal, round, and reactive to light. Right eye exhibits no discharge. Left eye exhibits no discharge. No scleral icterus.  Neck: Normal range of motion. Neck supple. No JVD present. No tracheal deviation present. No thyromegaly present.  Cardiovascular: Normal rate, regular rhythm, normal heart sounds and intact distal pulses.  Exam reveals no gallop and no friction rub.   No murmur heard. Pulmonary/Chest: Effort normal and breath sounds normal. No stridor. No respiratory distress. She has no wheezes. She has no rales. She exhibits  no tenderness.  Abdominal: Soft. Bowel sounds are normal. She exhibits no distension and no mass. There is no tenderness. There is no rebound and no guarding.  Musculoskeletal: Normal range of motion. She exhibits no edema or tenderness.  Lymphadenopathy:    She has no cervical adenopathy.  Neurological: She is alert and oriented to person, place, and time. She has normal reflexes. No cranial nerve deficit. She exhibits normal muscle tone. Coordination normal.  Skin: Skin is warm. No rash noted. She is not diaphoretic. No erythema. No pallor.  Psychiatric: She has a normal mood and affect. Her behavior is normal. Judgment and thought content normal.  Vitals reviewed.         Assessment & Plan:  Routine general medical examination at a health care facility  Essential hypertension  Pure hypercholesterolemia  Other specified hypothyroidism   Patient will schedule her mammogram. She refuses a colonoscopy. She refuses a Pap smear. I will try to arrange for the patient to have cancer screening with the cologuard stool test.   Her blood pressures elevated. Will increase valsartan to 320 mg by mouth daily and recheck blood pressure in one month. Thyroid is appropriately treated and cholesterol looks pretty good for this patient. I will consult cardiology for a stress test and echocardiogram given the chest pain with exertion and dyspnea on exertion. She has previously seen Dr. Martinique

## 2016-08-21 NOTE — Progress Notes (Signed)
Cardiology Office Note    Date:  08/22/2016   ID:  Lynden, Carrithers 11-04-1960, MRN [id number]  PCP:  Leo Grosser, MD  Cardiologist:  Jessa Stinson [place], MD    History of Present Illness:  Victoria Hull is a 56 y.o. female seen at the request of Dr. Tanya Nones for evaluation of dyspnea. She was seen by me in 2012 with symptoms of tachycardia and atypical chest pain. A stress Echo was normal. She was seen again in 2014 for evaluation of a murmur. Echo showed moderate LVH with normal LV function. Grade 1 diastolic dysfunction. Normal valves. She has a history of relapsing polychondritis, HTN, and HLD.  She reports that for the past several months she has experienced increased dyspnea on exertion. This occurs with almost any walking. She does get some chest discomfort when she is SOB. No edema. No cough. She is highly allergic to cats and notes she has new neighbors that have cats. She does have some wheezing for which she uses albuterol inhaler. She is concerned with her family history with father dying at age 78 with CHF.   Past Medical History:  Diagnosis Date  . Asthma   . Chest pain   . DDD (degenerative disc disease), lumbosacral   . Hyperlipidemia   . Hypertension   . Hypothyroidism   . 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 Relapsing polychondritis   . Tachycardia     Past Surgical History:  Procedure Laterality Date  . CARPAL TUNNEL RELEASE     bilateral  . KNEE SURGERY     RIGHT KNEE  . TUBAL LIGATION  1986  . US ECHOCARDIOGRAPHY  10/03/2005   EF 55%    Current Medications: Outpatient Medications Prior to Visit  Medication Sig Dispense Refill  . albuterol (PROAIR HFA) 108 (90 BASE) MCG/ACT inhaler Inhale 2 puffs into the lungs every 6 (six) hours as needed. 1 Inhaler 6  . aspirin 325 MG tablet Take 325 mg by mouth daily.    . cloNIDine (CATAPRES) 0.1 MG tablet TAKE ONE TABLET BY MOUTH TWICE DAILY 60 tablet 11  . fenofibrate (TRICOR) 48 MG tablet  Take 1 tablet (48 mg total) by mouth daily. 30 tablet 3  . levothyroxine (SYNTHROID, LEVOTHROID) 125 MCG tablet Take 1 tablet (125 mcg total) by mouth daily. 90 tablet 3  . omeprazole (PRILOSEC) 20 MG capsule TAKE TWO CAPSULES BY MOUTH ONCE DAILY 60 capsule 11  . rosuvastatin (CRESTOR) 40 MG tablet TAKE ONE TABLET BY MOUTH ONCE DAILY 30 tablet 5  . valsartan (DIOVAN) 320 MG tablet Take 1 tablet (320 mg total) by mouth daily. 90 tablet 3  . Calcium Carbonate-Vitamin D (CALCIUM + D PO) Take by mouth once.    . cloNIDine (CATAPRES) 0.1 MG tablet TAKE ONE TABLET BY MOUTH TWICE DAILY 60 tablet 0  . Multiple Vitamin (MULTIVITAMIN) capsule Take 1 capsule by mouth daily.    . Multiple Vitamins-Minerals (VISION FORMULA PO) Take 1 tablet by mouth daily.     . pravastatin (PRAVACHOL) 40 MG tablet TAKE ONE TABLET BY MOUTH AT BEDTIME **NEEDS  OFFICE  VISIT  FOR  FURTHER  REFILLS** 30 tablet 0   No facility-administered medications prior to visit.      Allergies:   Cats claw [uncaria tomentosa (cats claw)]; Niaspan [niacin]; and Penicillins   Social History   Social History  . Marital status: Married    Spouse name: N/A  . Number of children: 5  . Years of education:  N/A   Social History Main Topics  . Smoking status: Never Smoker  . Smokeless tobacco: Never Used  . Alcohol use No  . Drug use: No  . Sexual activity: Not Asked   Other Topics Concern  . None   Social History Narrative  . None     Family History:  The patient's family history includes Asthma in her brother, child, and grandchild; Cancer in her daughter; Diabetes in her father; Emphysema in her paternal aunt; Heart disease in her father; Heart failure in her father; Hyperlipidemia in her father; Hypertension in her mother; Hypothyroidism in her mother; Parkinson's disease in her father.   ROS:   Please see the history of present illness.    ROS All other systems reviewed and are negative.   PHYSICAL EXAM:   VS:  BP (!)  160/98 Comment: Right arm.  Pulse 82   Ht 5\' 5"  (1.651 m)   Wt 178 lb (80.7 kg)   LMP 08/25/2010   BMI 29.62 kg/m    GEN: Well nourished, well developed, in no acute distress  HEENT: normal  Neck: no JVD, carotid bruits, or masses Cardiac: RRR; no murmurs, rubs, or gallops,no edema  Respiratory:  Bilateral expiratory wheezes. GI: soft, nontender, nondistended, + BS MS: no deformity or atrophy  Skin: warm and dry, no rash Neuro:  Alert and Oriented x 3, Strength and sensation are intact Psych: euthymic mood, full affect  Wt Readings from Last 3 Encounters:  08/22/16 178 lb (80.7 kg)  08/16/16 176 lb (79.8 kg)  07/28/15 174 lb (78.9 kg)      Studies/Labs Reviewed:   EKG:  EKG is not ordered today.  The ekg ordered today demonstrates N/A  Recent Labs: 08/14/2016: ALT 15; BUN 13; Creat 1.04; Hemoglobin 13.0; Platelets 414; Potassium 3.9; Sodium 145; TSH 2.39   Lipid Panel    Component Value Date/Time   CHOL 198 08/14/2016 0829   TRIG 223 (H) 08/14/2016 0829   HDL 41 (L) 08/14/2016 0829   CHOLHDL 4.8 08/14/2016 0829   VLDL 45 (H) 08/14/2016 0829   LDLCALC 112 (H) 08/14/2016 0829    Additional studies/ records that were reviewed today include:  Echo: 05/22/11: Study Conclusions  - Left ventricle: The cavity size was normal. Wall thickness was increased in a pattern of moderate LVH. Systolic function was normal. The estimated ejection fraction was in the range of 55% to 65%. Wall motion was normal; there were no regional wall motion abnormalities. Doppler parameters are consistent with abnormal left ventricular relaxation (grade 1 diastolic dysfunction). - Atrial septum: No defect or patent foramen ovale was identified.  ASSESSMENT:    1. Essential hypertension   2. Dyspnea on exertion   3. Mixed hyperlipidemia   4. Other chest pain      PLAN:  In order of problems listed above:  1. I suspect her dyspnea is most likely related to asthma. She  does have multiple risk factors for CAD including dyslipidemia, HTN, and family history of CAD. Will arrange for a Lexiscan myoview. If normal would recommend follow up with pulmonary- has seen Dr. Delton Coombes in the past.  2. BP is elevated- valsartan just increased to 320 mg daily. History of multiple intolerances that she states Dr. Tanya Nones is aware of. Monitor on increased dose.  3. Dyslipidemia is apparently much better than prior on high dose Crestor and fenofibrate. Reports triglycerides in past >1900.     Medication Adjustments/Labs and Tests Ordered: Current medicines are reviewed at  length with the patient today.  Concerns regarding medicines are outlined above.  Medication changes, Labs and Tests ordered today are listed in the Patient Instructions below. Patient Instructions  We will schedule you for a nuclear stress test      Signed, Blayton Huttner [place], MD  08/22/2016 1:13 PM    [facility] [address], [place], [place], [id number] [phone]

## 2016-08-22 ENCOUNTER — Ambulatory Visit (INDEPENDENT_AMBULATORY_CARE_PROVIDER_SITE_OTHER): Payer: Medicare Other | Admitting: Cardiology

## 2016-08-22 ENCOUNTER — Encounter: Payer: Self-pay | Admitting: Cardiology

## 2016-08-22 VITALS — BP 160/98 | HR 82 | Ht 65.0 in | Wt 178.0 lb

## 2016-08-22 DIAGNOSIS — R0600 Dyspnea, unspecified: Secondary | ICD-10-CM | POA: Insufficient documentation

## 2016-08-22 DIAGNOSIS — R0609 Other forms of dyspnea: Secondary | ICD-10-CM | POA: Diagnosis not present

## 2016-08-22 DIAGNOSIS — E782 Mixed hyperlipidemia: Secondary | ICD-10-CM

## 2016-08-22 DIAGNOSIS — R0789 Other chest pain: Secondary | ICD-10-CM

## 2016-08-22 DIAGNOSIS — I10 Essential (primary) hypertension: Secondary | ICD-10-CM | POA: Diagnosis not present

## 2016-08-22 DIAGNOSIS — E785 Hyperlipidemia, unspecified: Secondary | ICD-10-CM | POA: Insufficient documentation

## 2016-08-22 DIAGNOSIS — R079 Chest pain, unspecified: Secondary | ICD-10-CM | POA: Insufficient documentation

## 2016-08-22 NOTE — Patient Instructions (Signed)
We will schedule you for a nuclear stress test   

## 2016-08-23 ENCOUNTER — Telehealth (HOSPITAL_COMMUNITY): Payer: Self-pay

## 2016-08-23 NOTE — Telephone Encounter (Signed)
Encounter complete. 

## 2016-08-28 ENCOUNTER — Ambulatory Visit (HOSPITAL_COMMUNITY)
Admission: RE | Admit: 2016-08-28 | Discharge: 2016-08-28 | Disposition: A | Discharge status: Home / Self Care | Payer: Medicare Other | Source: Ambulatory Visit | Attending: Cardiovascular Disease | Admitting: Cardiovascular Disease

## 2016-08-28 DIAGNOSIS — E782 Mixed hyperlipidemia: Secondary | ICD-10-CM | POA: Diagnosis not present

## 2016-08-28 DIAGNOSIS — Z8249 Family history of ischemic heart disease and other diseases of the circulatory system: Secondary | ICD-10-CM | POA: Diagnosis not present

## 2016-08-28 DIAGNOSIS — J45909 Unspecified asthma, uncomplicated: Secondary | ICD-10-CM | POA: Insufficient documentation

## 2016-08-28 DIAGNOSIS — I10 Essential (primary) hypertension: Secondary | ICD-10-CM

## 2016-08-28 DIAGNOSIS — R0789 Other chest pain: Secondary | ICD-10-CM | POA: Diagnosis not present

## 2016-08-28 DIAGNOSIS — R0609 Other forms of dyspnea: Secondary | ICD-10-CM | POA: Diagnosis not present

## 2016-08-28 DIAGNOSIS — I5189 Other ill-defined heart diseases: Secondary | ICD-10-CM | POA: Insufficient documentation

## 2016-08-28 MED ORDER — TECHNETIUM TC 99M TETROFOSMIN IV KIT
30.5000 | PACK | Freq: Once | INTRAVENOUS | Status: AC | PRN
  Administered 2016-08-28: 30.5 via INTRAVENOUS
  Filled 2016-08-28: qty 31

## 2016-08-28 MED ORDER — REGADENOSON 0.4 MG/5ML IV SOLN
0.4000 mg | Freq: Once | INTRAVENOUS | Status: AC
  Administered 2016-08-28: 0.4 mg via INTRAVENOUS

## 2016-08-28 MED ORDER — TECHNETIUM TC 99M TETROFOSMIN IV KIT
10.5000 | PACK | Freq: Once | INTRAVENOUS | Status: AC | PRN
  Administered 2016-08-28: 10.5 via INTRAVENOUS
  Filled 2016-08-28: qty 11

## 2016-08-29 LAB — MYOCARDIAL PERFUSION IMAGING
CSEPPHR: 118 {beats}/min
LVDIAVOL: 69 mL (ref 46–106)
LVSYSVOL: 20 mL
Rest HR: 76 {beats}/min
SDS: 3
SRS: 6
SSS: 9
TID: 1.11

## 2016-09-25 LAB — COLOGUARD: Cologuard: NEGATIVE

## 2016-10-04 ENCOUNTER — Other Ambulatory Visit: Payer: Self-pay | Admitting: Family Medicine

## 2016-10-04 MED ORDER — ROSUVASTATIN CALCIUM 40 MG PO TABS: 40.0000 mg | ORAL_TABLET | Freq: Every day | ORAL | 0 refills | Status: DC

## 2016-10-04 NOTE — Telephone Encounter (Signed)
Medication refilled per protocol. 

## 2016-11-02 ENCOUNTER — Encounter: Payer: Self-pay | Admitting: Family Medicine

## 2016-11-05 ENCOUNTER — Encounter: Payer: Self-pay | Admitting: *Deleted

## 2016-12-20 ENCOUNTER — Other Ambulatory Visit: Payer: Self-pay | Admitting: Family Medicine

## 2017-01-07 ENCOUNTER — Ambulatory Visit (INDEPENDENT_AMBULATORY_CARE_PROVIDER_SITE_OTHER): Payer: Medicare Other | Admitting: Family Medicine

## 2017-01-07 ENCOUNTER — Encounter: Payer: Self-pay | Admitting: Family Medicine

## 2017-01-07 VITALS — BP 138/94 | HR 76 | Temp 97.8°F | Resp 16 | Ht 65.0 in

## 2017-01-07 DIAGNOSIS — A6920 Lyme disease, unspecified: Secondary | ICD-10-CM

## 2017-01-07 MED ORDER — DOXYCYCLINE HYCLATE 100 MG PO TABS: 100.0000 mg | ORAL_TABLET | Freq: Two times a day (BID) | ORAL | 0 refills | Status: DC

## 2017-01-07 NOTE — Progress Notes (Signed)
Subjective:    Patient ID: Victoria Hull, female    DOB: [birth year], 58 y.o.   MRN: [id number]  HPI  Saturday night, the patient began itching on the right side of her mid back below her shoulder blade. She looked in the mirror and saw an erythematous patch the diameter of a baseball with central clearing consistent with erythema migrans. In the center was a tick. She removed the tick in its entirety. She is already started some doxycycline she had at home for another reason and the rash has since subsided. However she has photographic evidence of the rash and it is consistent with erythema migrans Past Medical History:  Diagnosis Date  . Asthma   . Chest pain   . DDD (degenerative disc disease), lumbosacral   . Hyperlipidemia   . Hypertension   . Hypothyroidism   . 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 Relapsing polychondritis   . Tachycardia    Past Surgical History:  Procedure Laterality Date  . CARPAL TUNNEL RELEASE     bilateral  . KNEE SURGERY     RIGHT KNEE  . TUBAL LIGATION  1986  . US ECHOCARDIOGRAPHY  10/03/2005   EF 55%   Current Outpatient Prescriptions on File Prior to Visit  Medication Sig Dispense Refill  . albuterol (PROAIR HFA) 108 (90 BASE) MCG/ACT inhaler Inhale 2 puffs into the lungs every 6 (six) hours as needed. 1 Inhaler 6  . aspirin 325 MG tablet Take 325 mg by mouth daily.    . cloNIDine (CATAPRES) 0.1 MG tablet TAKE ONE TABLET BY MOUTH TWICE DAILY 60 tablet 11  . fenofibrate (TRICOR) 48 MG tablet Take 1 tablet (48 mg total) by mouth daily. 30 tablet 3  . levothyroxine (SYNTHROID, LEVOTHROID) 125 MCG tablet TAKE ONE TABLET BY MOUTH ONCE DAILY 90 tablet 3  . omeprazole (PRILOSEC) 20 MG capsule TAKE TWO CAPSULES BY MOUTH ONCE DAILY 60 capsule 11  . rosuvastatin (CRESTOR) 40 MG tablet Take 1 tablet (40 mg total) by mouth daily at 6 PM. 90 tablet 0  . valsartan (DIOVAN) 320 MG tablet Take 1 tablet (320 mg total) by mouth daily. (Patient not  taking: Reported on 01/07/2017) 90 tablet 3   No current facility-administered medications on file prior to visit.    Allergies  Allergen Reactions  . Cats Claw [Uncaria Tomentosa (Cats Claw)]     cats  . Niaspan [Niacin] Other (See Comments)    Myalgias in legs  . Penicillins    Social History   Social History  . Marital status: Married    Spouse name: N/A  . Number of children: 5  . Years of education: N/A   Occupational History  . Not on file.   Social History Main Topics  . Smoking status: Never Smoker  . Smokeless tobacco: Never Used  . Alcohol use No  . Drug use: No  . Sexual activity: Not on file   Other Topics Concern  . Not on file   Social History Narrative  . No narrative on file     Review of Systems  All other systems reviewed and are negative.      Objective:   Physical Exam  Constitutional: She appears well-developed and well-nourished.  Cardiovascular: Normal rate, regular rhythm and normal heart sounds.   Pulmonary/Chest: Effort normal and breath sounds normal.  Abdominal: Soft. Bowel sounds are normal.  Skin: No rash noted. No erythema.  Vitals reviewed.  Assessment & Plan:  Lyme disease - Plan: doxycycline (VIBRA-TABS) 100 MG tablet  Erythema chronicum migrans  Take doxycycline 100 mg by mouth twice a day for 21 days total. I gave her prescription sufficient for this. Recheck if problems develop.

## 2017-01-11 ENCOUNTER — Other Ambulatory Visit: Payer: Self-pay | Admitting: Family Medicine

## 2017-01-25 ENCOUNTER — Encounter: Payer: Self-pay | Admitting: Family Medicine

## 2017-03-16 ENCOUNTER — Other Ambulatory Visit: Payer: Self-pay | Admitting: Family Medicine

## 2017-03-18 NOTE — Telephone Encounter (Signed)
Patient needs to be seen before any further refills 

## 2017-08-05 ENCOUNTER — Other Ambulatory Visit: Payer: Self-pay | Admitting: Family Medicine

## 2017-08-05 MED ORDER — OMEPRAZOLE 20 MG PO CPDR: DELAYED_RELEASE_CAPSULE | ORAL | 1 refills | Status: DC

## 2017-08-05 MED ORDER — LEVOTHYROXINE SODIUM 125 MCG PO TABS: 125.0000 ug | ORAL_TABLET | Freq: Every day | ORAL | 1 refills | Status: DC

## 2017-08-05 MED ORDER — CLONIDINE HCL 0.1 MG PO TABS: 0.1000 mg | ORAL_TABLET | Freq: Two times a day (BID) | ORAL | 1 refills | Status: DC

## 2017-09-13 ENCOUNTER — Encounter: Payer: Self-pay | Admitting: Family Medicine

## 2017-11-04 ENCOUNTER — Encounter: Payer: Medicare Other | Admitting: Family Medicine

## 2017-11-05 ENCOUNTER — Ambulatory Visit (INDEPENDENT_AMBULATORY_CARE_PROVIDER_SITE_OTHER): Payer: Medicare Other | Admitting: Family Medicine

## 2017-11-05 ENCOUNTER — Encounter: Payer: Self-pay | Admitting: Family Medicine

## 2017-11-05 VITALS — BP 160/90 | HR 94 | Temp 97.6°F | Resp 18 | Ht 65.0 in | Wt 178.0 lb

## 2017-11-05 DIAGNOSIS — Z1239 Encounter for other screening for malignant neoplasm of breast: Secondary | ICD-10-CM

## 2017-11-05 DIAGNOSIS — Z0000 Encounter for general adult medical examination without abnormal findings: Secondary | ICD-10-CM

## 2017-11-05 DIAGNOSIS — Z1231 Encounter for screening mammogram for malignant neoplasm of breast: Secondary | ICD-10-CM | POA: Diagnosis not present

## 2017-11-05 DIAGNOSIS — I10 Essential (primary) hypertension: Secondary | ICD-10-CM | POA: Diagnosis not present

## 2017-11-05 DIAGNOSIS — Z23 Encounter for immunization: Secondary | ICD-10-CM | POA: Diagnosis not present

## 2017-11-05 DIAGNOSIS — R0609 Other forms of dyspnea: Secondary | ICD-10-CM

## 2017-11-05 DIAGNOSIS — E7800 Pure hypercholesterolemia, unspecified: Secondary | ICD-10-CM

## 2017-11-05 DIAGNOSIS — E039 Hypothyroidism, unspecified: Secondary | ICD-10-CM | POA: Diagnosis not present

## 2017-11-05 MED ORDER — AMLODIPINE BESYLATE 10 MG PO TABS: 10.0000 mg | ORAL_TABLET | Freq: Every day | ORAL | 3 refills | Status: AC

## 2017-11-05 NOTE — Addendum Note (Signed)
Addended by: Legrand Rams B on: 11/05/2017 05:17 PM   Modules accepted: Orders

## 2017-11-05 NOTE — Progress Notes (Signed)
Subjective:    Patient ID: Victoria Hull, female    DOB: Jun 14, 1961, 57 y.o.   MRN: [id number]  HPI  08/2016 Patient is a very pleasant 57 year old white female who is here today for complete physical exam.  Immunizations are up-to-date. She's had Pneumovax 23, Prevnar 13, and the flu shot. She had a bone density test in 2010. I would repeat this when she turns 58 due to her history of prednisone use.  She has refused pap smears and colonoscopies in the past although she did have cologuard last year.  Last mammogram 08/01/15.  She continues to refuse a colonoscopy. She continues to refuse a Pap smear. She will schedule her mammogram. She would like me to schedule her for a cologuard.  Her blood pressure today is elevated. She states his running even higher at home. She is also been reporting dyspnea on exertion. She has to stop walking up steps to catch her breath. She also been having some chest pain with exertion. She denies any angina at rest. She denies any nausea vomiting or radiation of the chest pain. However the shortness of breath with exertion has gotten to the point that she is concerned and would like to see a cardiologist.  At that time, my plan was:  Patient will schedule her mammogram. She refuses a colonoscopy. She refuses a Pap smear. I will try to arrange for the patient to have cancer screening with the cologuard stool test.   Her blood pressures elevated. Will increase valsartan to 320 mg by mouth daily and recheck blood pressure in one month. Thyroid is appropriately treated and cholesterol looks pretty good for this patient. I will consult cardiology for a stress test and echocardiogram given the chest pain with exertion and dyspnea on exertion. She has previously seen Dr. [place]  11/05/17 Patient is here today for complete physical exam.  Last year she saw her cardiologist and had a nuclear stress test.  There was no evidence of ischemia and her ejection fraction was 72%.  She  continues to have dyspnea on exertion and thought was is most likely related to deconditioning and asthma.  She is not currently taking any daily prophylactic medication for asthma.  She does notice some benefit when she uses albuterol as needed as far as her shortness of breath with activity.  She is due for a mammogram.  Her bone density was performed in 2012 and was normal.  She is not due again until she turns 63.  She is due for a Pap smear but she adamantly refuses this.  She had a Cologuard last year.  This is good for 3 years.  She declines a colonoscopy.  Her blood pressure today is elevated 160/90.  She is taking clonidine twice daily. Past Medical History:  Diagnosis Date  . Asthma   . Chest pain   . DDD (degenerative disc disease), lumbosacral   . Hyperlipidemia   . Hypertension   . Hypothyroidism   . 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 Relapsing polychondritis   . Tachycardia    Past Surgical History:  Procedure Laterality Date  . CARPAL TUNNEL RELEASE     bilateral  . KNEE SURGERY     RIGHT KNEE  . TUBAL LIGATION  1986  . US ECHOCARDIOGRAPHY  10/03/2005   EF 55%   Current Outpatient Medications on File Prior to Visit  Medication Sig Dispense Refill  . cloNIDine (CATAPRES) 0.1 MG tablet Take 1 tablet (0.1 mg total)  by mouth 2 (two) times daily. 180 tablet 1  . levothyroxine (SYNTHROID, LEVOTHROID) 125 MCG tablet Take 1 tablet (125 mcg total) by mouth daily. 90 tablet 1  . omeprazole (PRILOSEC) 20 MG capsule TAKE TWO CAPSULES BY MOUTH ONCE DAILY 180 capsule 1   No current facility-administered medications on file prior to visit.    Allergies  Allergen Reactions  . Cats Claw [Uncaria Tomentosa (Cats Claw)]     cats  . Niaspan [Niacin] Other (See Comments)    Myalgias in legs  . Penicillins    Social History   Socioeconomic History  . Marital status: Married    Spouse name: Not on file  . Number of children: 5  . Years of education: Not on file  .  Highest education level: Not on file  Occupational History  . Not on file  Social Needs  . Financial resource strain: Not on file  . Food insecurity:    Worry: Not on file    Inability: Not on file  . Transportation needs:    Medical: Not on file    Non-medical: Not on file  Tobacco Use  . Smoking status: Never Smoker  . Smokeless tobacco: Never Used  Substance and Sexual Activity  . Alcohol use: No  . Drug use: No  . Sexual activity: Not on file  Lifestyle  . Physical activity:    Days per week: Not on file    Minutes per session: Not on file  . Stress: Not on file  Relationships  . Social connections:    Talks on phone: Not on file    Gets together: Not on file    Attends religious service: Not on file    Active member of club or organization: Not on file    Attends meetings of clubs or organizations: Not on file    Relationship status: Not on file  . Intimate partner violence:    Fear of current or ex partner: Not on file    Emotionally abused: Not on file    Physically abused: Not on file    Forced sexual activity: Not on file  Other Topics Concern  . Not on file  Social History Narrative  . Not on file   Family History  Problem Relation Age of Onset  . Heart failure Father   . Hyperlipidemia Father        chf  . Diabetes Father   . Parkinson's disease Father   . Heart disease Father        Premature CAD Fam HX  . Hypertension Mother   . Hypothyroidism Mother   . Emphysema Paternal Aunt   . Asthma Brother   . Asthma Child   . Asthma Grandchild   . Cancer Daughter        CERVICAL      Review of Systems  All other systems reviewed and are negative.      Objective:   Physical Exam  Constitutional: She is oriented to person, place, and time. She appears well-developed and well-nourished. No distress.  HENT:  Head: Normocephalic and atraumatic.  Right Ear: External ear normal.  Left Ear: External ear normal.  Nose: Nose normal.  Mouth/Throat:  Oropharynx is clear and moist. No oropharyngeal exudate.  Eyes: Pupils are equal, round, and reactive to light. Conjunctivae and EOM are normal. Right eye exhibits no discharge. Left eye exhibits no discharge. No scleral icterus.  Neck: Normal range of motion. Neck supple. No JVD present. No tracheal  deviation present. No thyromegaly present.  Cardiovascular: Normal rate, regular rhythm, normal heart sounds and intact distal pulses. Exam reveals no gallop and no friction rub.  No murmur heard. Pulmonary/Chest: Effort normal and breath sounds normal. No stridor. No respiratory distress. She has no wheezes. She has no rales. She exhibits no tenderness.  Abdominal: Soft. Bowel sounds are normal. She exhibits no distension and no mass. There is no tenderness. There is no rebound and no guarding.  Musculoskeletal: Normal range of motion. She exhibits no edema or tenderness.  Lymphadenopathy:    She has no cervical adenopathy.  Neurological: She is alert and oriented to person, place, and time. She has normal reflexes. No cranial nerve deficit. She exhibits normal muscle tone. Coordination normal.  Skin: Skin is warm. No rash noted. She is not diaphoretic. No erythema. No pallor.  Psychiatric: She has a normal mood and affect. Her behavior is normal. Judgment and thought content normal.  Vitals reviewed.         Assessment & Plan:  Benign essential HTN - Plan: CBC with Differential/Platelet, COMPLETE METABOLIC PANEL WITH GFR, Lipid panel, TSH  Hypothyroidism, unspecified type - Plan: TSH  Routine general medical examination at a health care facility  Pure hypercholesterolemia  Dyspnea on exertion I am very concerned about her blood pressure.  I will add amlodipine 10 mg p.o. daily to the clonidine and recheck blood pressure in 1 month.  I believe her dyspnea on exertion is a combination of deconditioning and likely uncontrolled asthma.  I recommended starting Symbicort 80/4.5, 2 puffs inhaled  twice daily and reassess in 1 month.  I will check a CBC, CMP, fasting lipid panel, and TSH.  Patient received a booster on Pneumovax 23 as it has been more than 5 years since she had it.  The remainder of her immunizations are up-to-date.  She adamantly declines the flu shot.  She refuses a Pap smear.  She refuses a colonoscopy but her Cologuard is up-to-date.  Bone density test is not due

## 2017-11-06 LAB — CBC WITH DIFFERENTIAL/PLATELET
BASOS PCT: 0.7 %
Basophils Absolute: 51 cells/uL (ref 0–200)
EOS PCT: 3 %
Eosinophils Absolute: 219 cells/uL (ref 15–500)
HCT: 41.5 % (ref 35.0–45.0)
Hemoglobin: 13.4 g/dL (ref 11.7–15.5)
Lymphs Abs: 2059 cells/uL (ref 850–3900)
MCH: 27 pg (ref 27.0–33.0)
MCHC: 32.3 g/dL (ref 32.0–36.0)
MCV: 83.7 fL (ref 80.0–100.0)
MONOS PCT: 7.3 %
MPV: 9.2 fL (ref 7.5–12.5)
Neutro Abs: 4438 cells/uL (ref 1500–7800)
Neutrophils Relative %: 60.8 %
PLATELETS: 395 10*3/uL (ref 140–400)
RBC: 4.96 10*6/uL (ref 3.80–5.10)
RDW: 13.6 % (ref 11.0–15.0)
TOTAL LYMPHOCYTE: 28.2 %
WBC: 7.3 10*3/uL (ref 3.8–10.8)
WBCMIX: 533 {cells}/uL (ref 200–950)

## 2017-11-06 LAB — COMPLETE METABOLIC PANEL WITH GFR
AG Ratio: 1.2 (calc) (ref 1.0–2.5)
ALT: 21 U/L (ref 6–29)
AST: 19 U/L (ref 10–35)
Albumin: 3.9 g/dL (ref 3.6–5.1)
Alkaline phosphatase (APISO): 92 U/L (ref 33–130)
BUN: 12 mg/dL (ref 7–25)
CALCIUM: 9.3 mg/dL (ref 8.6–10.4)
CO2: 32 mmol/L (ref 20–32)
CREATININE: 0.91 mg/dL (ref 0.50–1.05)
Chloride: 102 mmol/L (ref 98–110)
GFR, EST AFRICAN AMERICAN: 81 mL/min/{1.73_m2} (ref >=60)
GFR, EST NON AFRICAN AMERICAN: 70 mL/min/{1.73_m2} (ref >=60)
GLUCOSE: 101 mg/dL — AB (ref 65–99)
Globulin: 3.2 g/dL (calc) (ref 1.9–3.7)
Potassium: 4.5 mmol/L (ref 3.5–5.3)
Sodium: 142 mmol/L (ref 135–146)
TOTAL PROTEIN: 7.1 g/dL (ref 6.1–8.1)
Total Bilirubin: 0.3 mg/dL (ref 0.2–1.2)

## 2017-11-06 LAB — LIPID PANEL
Cholesterol: 251 mg/dL — ABNORMAL HIGH (ref <200)
HDL: 39 mg/dL — AB (ref >50)
LDL CHOLESTEROL (CALC): 165 mg/dL — AB
NON-HDL CHOLESTEROL (CALC): 212 mg/dL — AB (ref <130)
TRIGLYCERIDES: 307 mg/dL — AB (ref <150)
Total CHOL/HDL Ratio: 6.4 (calc) — ABNORMAL HIGH (ref <5.0)

## 2017-11-06 LAB — TSH: TSH: 1.3 m[IU]/L (ref 0.40–4.50)

## 2017-11-07 MED ORDER — ATORVASTATIN CALCIUM 20 MG PO TABS: 20.0000 mg | ORAL_TABLET | Freq: Every day | ORAL | 3 refills | Status: DC

## 2017-12-06 ENCOUNTER — Encounter: Payer: Self-pay | Admitting: Family Medicine

## 2017-12-06 ENCOUNTER — Ambulatory Visit (INDEPENDENT_AMBULATORY_CARE_PROVIDER_SITE_OTHER): Payer: Medicare Other | Admitting: Family Medicine

## 2017-12-06 VITALS — BP 150/90 | HR 84 | Temp 97.6°F | Resp 16 | Ht 65.0 in | Wt 180.0 lb

## 2017-12-06 DIAGNOSIS — Z1239 Encounter for other screening for malignant neoplasm of breast: Secondary | ICD-10-CM

## 2017-12-06 DIAGNOSIS — I10 Essential (primary) hypertension: Secondary | ICD-10-CM

## 2017-12-06 DIAGNOSIS — Z1231 Encounter for screening mammogram for malignant neoplasm of breast: Secondary | ICD-10-CM

## 2017-12-06 MED ORDER — HYDROCHLOROTHIAZIDE 25 MG PO TABS: 25.0000 mg | ORAL_TABLET | Freq: Every day | ORAL | 3 refills | Status: DC

## 2017-12-06 NOTE — Progress Notes (Signed)
Subjective:    Patient ID: Victoria Hull, female    DOB: 1960-08-21, 57 y.o.   MRN: [id number]  HPI  08/2016 Patient is a very pleasant 57 year old white female who is here today for complete physical exam.  Immunizations are up-to-date. She's had Pneumovax 23, Prevnar 13, and the flu shot. She had a bone density test in 2010. I would repeat this when she turns 58 due to her history of prednisone use.  She has refused pap smears and colonoscopies in the past although she did have cologuard last year.  Last mammogram 08/01/15.  She continues to refuse a colonoscopy. She continues to refuse a Pap smear. She will schedule her mammogram. She would like me to schedule her for a cologuard.  Her blood pressure today is elevated. She states his running even higher at home. She is also been reporting dyspnea on exertion. She has to stop walking up steps to catch her breath. She also been having some chest pain with exertion. She denies any angina at rest. She denies any nausea vomiting or radiation of the chest pain. However the shortness of breath with exertion has gotten to the point that she is concerned and would like to see a cardiologist.  At that time, my plan was:  Patient will schedule her mammogram. She refuses a colonoscopy. She refuses a Pap smear. I will try to arrange for the patient to have cancer screening with the cologuard stool test.   Her blood pressures elevated. Will increase valsartan to 320 mg by mouth daily and recheck blood pressure in one month. Thyroid is appropriately treated and cholesterol looks pretty good for this patient. I will consult cardiology for a stress test and echocardiogram given the chest pain with exertion and dyspnea on exertion. She has previously seen Dr. [place]  11/05/17 Patient is here today for complete physical exam.  Last year she saw her cardiologist and had a nuclear stress test.  There was no evidence of ischemia and her ejection fraction was 72%.  She  continues to have dyspnea on exertion and thought was is most likely related to deconditioning and asthma.  She is not currently taking any daily prophylactic medication for asthma.  She does notice some benefit when she uses albuterol as needed as far as her shortness of breath with activity.  She is due for a mammogram.  Her bone density was performed in 2012 and was normal.  She is not due again until she turns 63.  She is due for a Pap smear but she adamantly refuses this.  She had a Cologuard last year.  This is good for 3 years.  She declines a colonoscopy.  Her blood pressure today is elevated 160/90.  She is taking clonidine twice daily.  At that time, my plan was: I am very concerned about her blood pressure.  I will add amlodipine 10 mg p.o. daily to the clonidine and recheck blood pressure in 1 month.  I believe her dyspnea on exertion is a combination of deconditioning and likely uncontrolled asthma.  I recommended starting Symbicort 80/4.5, 2 puffs inhaled twice daily and reassess in 1 month.  I will check a CBC, CMP, fasting lipid panel, and TSH.  Patient received a booster on Pneumovax 23 as it has been more than 5 years since she had it.  The remainder of her immunizations are up-to-date.  She adamantly declines the flu shot.  She refuses a Pap smear.  She refuses a colonoscopy but  her Cologuard is up-to-date.  Bone density test is not due  12/06/17 Patient's blood pressure is better but still elevated at 150/90.  She also complains of leg swelling since starting the amlodipine.  Otherwise she denies any angina.  She does have some mild shortness of breath with activity but this is chronic and she attributes it to her asthma.  She started Lipitor for her cholesterol and is due to recheck fasting lipid panel in 3 months  Past Medical History:  Diagnosis Date  . Asthma   . Chest pain   . DDD (degenerative disc disease), lumbosacral   . Hyperlipidemia   . Hypertension   . Hypothyroidism   .  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 Relapsing polychondritis   . Tachycardia    Past Surgical History:  Procedure Laterality Date  . CARPAL TUNNEL RELEASE     bilateral  . KNEE SURGERY     RIGHT KNEE  . TUBAL LIGATION  1986  . US ECHOCARDIOGRAPHY  10/03/2005   EF 55%   Current Outpatient Medications on File Prior to Visit  Medication Sig Dispense Refill  . amLODipine (NORVASC) 10 MG tablet Take 1 tablet (10 mg total) by mouth daily. 90 tablet 3  . atorvastatin (LIPITOR) 20 MG tablet Take 1 tablet (20 mg total) by mouth daily. 90 tablet 3  . cloNIDine (CATAPRES) 0.1 MG tablet Take 1 tablet (0.1 mg total) by mouth 2 (two) times daily. 180 tablet 1  . levothyroxine (SYNTHROID, LEVOTHROID) 125 MCG tablet Take 1 tablet (125 mcg total) by mouth daily. 90 tablet 1  . omeprazole (PRILOSEC) 20 MG capsule TAKE TWO CAPSULES BY MOUTH ONCE DAILY 180 capsule 1   No current facility-administered medications on file prior to visit.    Allergies  Allergen Reactions  . Cats Claw [Uncaria Tomentosa (Cats Claw)]     cats  . Niaspan [Niacin] Other (See Comments)    Myalgias in legs  . Penicillins    Social History   Socioeconomic History  . Marital status: Married    Spouse name: Not on file  . Number of children: 5  . Years of education: Not on file  . Highest education level: Not on file  Occupational History  . Not on file  Social Needs  . Financial resource strain: Not on file  . Food insecurity:    Worry: Not on file    Inability: Not on file  . Transportation needs:    Medical: Not on file    Non-medical: Not on file  Tobacco Use  . Smoking status: Never Smoker  . Smokeless tobacco: Never Used  Substance and Sexual Activity  . Alcohol use: No  . Drug use: No  . Sexual activity: Not on file  Lifestyle  . Physical activity:    Days per week: Not on file    Minutes per session: Not on file  . Stress: Not on file  Relationships  . Social connections:    Talks on  phone: Not on file    Gets together: Not on file    Attends religious service: Not on file    Active member of club or organization: Not on file    Attends meetings of clubs or organizations: Not on file    Relationship status: Not on file  . Intimate partner violence:    Fear of current or ex partner: Not on file    Emotionally abused: Not on file    Physically abused: Not  on file    Forced sexual activity: Not on file  Other Topics Concern  . Not on file  Social History Narrative  . Not on file   Family History  Problem Relation Age of Onset  . Heart failure Father   . Hyperlipidemia Father        chf  . Diabetes Father   . Parkinson's disease Father   . Heart disease Father        Premature CAD Fam HX  . Hypertension Mother   . Hypothyroidism Mother   . Emphysema Paternal Aunt   . Asthma Brother   . Asthma Child   . Asthma Grandchild   . Cancer Daughter        CERVICAL      Review of Systems  All other systems reviewed and are negative.      Objective:   Physical Exam  Constitutional: She is oriented to person, place, and time. She appears well-developed and well-nourished. No distress.  HENT:  Head: Normocephalic and atraumatic.  Right Ear: External ear normal.  Left Ear: External ear normal.  Nose: Nose normal.  Mouth/Throat: Oropharynx is clear and moist. No oropharyngeal exudate.  Eyes: Pupils are equal, round, and reactive to light. Conjunctivae and EOM are normal. Right eye exhibits no discharge. Left eye exhibits no discharge. No scleral icterus.  Neck: Normal range of motion. Neck supple. No JVD present. No tracheal deviation present. No thyromegaly present.  Cardiovascular: Normal rate, regular rhythm, normal heart sounds and intact distal pulses. Exam reveals no gallop and no friction rub.  No murmur heard. Pulmonary/Chest: Effort normal and breath sounds normal. No stridor. No respiratory distress. She has no wheezes. She has no rales. She exhibits  no tenderness.  Abdominal: Soft. Bowel sounds are normal. She exhibits no distension and no mass. There is no tenderness. There is no rebound and no guarding.  Musculoskeletal: Normal range of motion. She exhibits no edema or tenderness.  Lymphadenopathy:    She has no cervical adenopathy.  Neurological: She is alert and oriented to person, place, and time. She has normal reflexes. No cranial nerve deficit. She exhibits normal muscle tone. Coordination normal.  Skin: Skin is warm. No rash noted. She is not diaphoretic. No erythema. No pallor.  Psychiatric: She has a normal mood and affect. Her behavior is normal. Judgment and thought content normal.  Vitals reviewed.         Assessment & Plan:  Breast cancer screening - Plan: MM Digital Screening Essential hypertension  Add hydrochlorothiazide 25 mg a day to amlodipine and clonidine and recheck blood pressure in 1 month.  Return fasting for a fasting lipid panel in 3 months.  Schedule mammogram which has not been scheduled yet

## 2017-12-10 ENCOUNTER — Other Ambulatory Visit: Payer: Self-pay | Admitting: Family Medicine

## 2017-12-19 ENCOUNTER — Encounter: Payer: Self-pay | Admitting: Family Medicine

## 2018-01-06 ENCOUNTER — Ambulatory Visit
Admission: RE | Admit: 2018-01-06 | Discharge: 2018-01-06 | Disposition: A | Discharge status: Home / Self Care | Payer: Medicare Other | Source: Ambulatory Visit | Attending: Family Medicine | Admitting: Family Medicine

## 2018-01-06 DIAGNOSIS — Z1239 Encounter for other screening for malignant neoplasm of breast: Secondary | ICD-10-CM

## 2018-01-06 DIAGNOSIS — Z1231 Encounter for screening mammogram for malignant neoplasm of breast: Secondary | ICD-10-CM | POA: Diagnosis not present

## 2018-02-10 ENCOUNTER — Other Ambulatory Visit: Payer: Self-pay | Admitting: Family Medicine

## 2018-02-10 MED ORDER — HYDROCHLOROTHIAZIDE 25 MG PO TABS: 25.0000 mg | ORAL_TABLET | Freq: Every day | ORAL | 3 refills | Status: DC

## 2018-03-10 ENCOUNTER — Other Ambulatory Visit: Payer: Medicare Other

## 2018-03-10 DIAGNOSIS — E7800 Pure hypercholesterolemia, unspecified: Secondary | ICD-10-CM | POA: Diagnosis not present

## 2018-03-10 LAB — LIPID PANEL
CHOLESTEROL: 172 mg/dL (ref <200)
HDL: 40 mg/dL — AB (ref >50)
LDL Cholesterol (Calc): 107 mg/dL (calc) — ABNORMAL HIGH
Non-HDL Cholesterol (Calc): 132 mg/dL (calc) — ABNORMAL HIGH (ref <130)
TRIGLYCERIDES: 135 mg/dL (ref <150)
Total CHOL/HDL Ratio: 4.3 (calc) (ref <5.0)

## 2018-03-20 ENCOUNTER — Other Ambulatory Visit: Payer: Self-pay | Admitting: Family Medicine

## 2018-05-23 ENCOUNTER — Encounter: Payer: Self-pay | Admitting: Family Medicine

## 2018-06-06 DIAGNOSIS — H2513 Age-related nuclear cataract, bilateral: Secondary | ICD-10-CM | POA: Diagnosis not present

## 2018-06-06 DIAGNOSIS — H353132 Nonexudative age-related macular degeneration, bilateral, intermediate dry stage: Secondary | ICD-10-CM | POA: Diagnosis not present

## 2018-06-06 DIAGNOSIS — H25013 Cortical age-related cataract, bilateral: Secondary | ICD-10-CM | POA: Diagnosis not present

## 2018-06-06 DIAGNOSIS — H35033 Hypertensive retinopathy, bilateral: Secondary | ICD-10-CM | POA: Diagnosis not present

## 2018-09-18 ENCOUNTER — Other Ambulatory Visit: Payer: Self-pay | Admitting: Family Medicine

## 2018-09-19 ENCOUNTER — Other Ambulatory Visit: Payer: Self-pay | Admitting: Family Medicine

## 2018-12-12 ENCOUNTER — Other Ambulatory Visit: Payer: Self-pay | Admitting: Family Medicine

## 2019-03-07 ENCOUNTER — Other Ambulatory Visit: Payer: Self-pay | Admitting: Family Medicine

## 2019-11-20 ENCOUNTER — Telehealth: Payer: Self-pay | Admitting: *Deleted

## 2019-11-20 NOTE — Telephone Encounter (Signed)
Patient due for re-screen on Cologuard.   Cologuard (Order [id number])

## 2020-12-27 ENCOUNTER — Telehealth: Payer: Self-pay | Admitting: *Deleted

## 2020-12-27 NOTE — Telephone Encounter (Signed)
Patient due for Cologuard re-screen.   Order placed via [employer].   Cologuard (Order [id number])
